# Patient Record
Sex: Male | Born: 1965 | Race: White | Hispanic: No | Marital: Single | Smoking: Former smoker
Health system: Southern US, Community
[De-identification: ages and names within clinical notes are randomized; demographics above are authoritative.]

---

## 2019-03-03 ENCOUNTER — Encounter (HOSPITAL_COMMUNITY): Payer: Self-pay

## 2019-03-03 ENCOUNTER — Emergency Department (HOSPITAL_COMMUNITY)
Admission: EM | Admit: 2019-03-03 | Discharge: 2019-03-04 | Disposition: A | Discharge status: Home / Self Care | Payer: Worker's Compensation | Attending: Emergency Medicine | Admitting: Emergency Medicine

## 2019-03-03 ENCOUNTER — Other Ambulatory Visit: Payer: Self-pay

## 2019-03-03 DIAGNOSIS — Y9389 Activity, other specified: Secondary | ICD-10-CM | POA: Diagnosis not present

## 2019-03-03 DIAGNOSIS — S80811A Abrasion, right lower leg, initial encounter: Secondary | ICD-10-CM | POA: Diagnosis not present

## 2019-03-03 DIAGNOSIS — Z87891 Personal history of nicotine dependence: Secondary | ICD-10-CM | POA: Insufficient documentation

## 2019-03-03 DIAGNOSIS — Y990 Civilian activity done for income or pay: Secondary | ICD-10-CM | POA: Diagnosis not present

## 2019-03-03 DIAGNOSIS — Y92812 Truck as the place of occurrence of the external cause: Secondary | ICD-10-CM | POA: Diagnosis not present

## 2019-03-03 DIAGNOSIS — W1789XA Other fall from one level to another, initial encounter: Secondary | ICD-10-CM | POA: Diagnosis not present

## 2019-03-03 DIAGNOSIS — I10 Essential (primary) hypertension: Secondary | ICD-10-CM | POA: Diagnosis not present

## 2019-03-03 DIAGNOSIS — I878 Other specified disorders of veins: Secondary | ICD-10-CM | POA: Insufficient documentation

## 2019-03-03 DIAGNOSIS — S8991XA Unspecified injury of right lower leg, initial encounter: Secondary | ICD-10-CM | POA: Diagnosis present

## 2019-03-03 LAB — LACTIC ACID, PLASMA: Lactic Acid, Venous: 1.2 mmol/L (ref 0.5–1.9)

## 2019-03-03 LAB — COMPREHENSIVE METABOLIC PANEL
ALT: 37 U/L (ref 0–44)
AST: 28 U/L (ref 15–41)
Albumin: 3.6 g/dL (ref 3.5–5.0)
Alkaline Phosphatase: 40 U/L (ref 38–126)
Anion gap: 11 (ref 5–15)
BUN: 12 mg/dL (ref 6–20)
CO2: 24 mmol/L (ref 22–32)
Calcium: 9.6 mg/dL (ref 8.9–10.3)
Chloride: 100 mmol/L (ref 98–111)
Creatinine, Ser: 1.15 mg/dL (ref 0.61–1.24)
GFR calc Af Amer: >60 mL/min (ref >60)
GFR calc non Af Amer: >60 mL/min (ref >60)
Glucose, Bld: 162 mg/dL — ABNORMAL HIGH (ref 70–99)
Potassium: 3.8 mmol/L (ref 3.5–5.1)
Sodium: 135 mmol/L (ref 135–145)
Total Bilirubin: 0.6 mg/dL (ref 0.3–1.2)
Total Protein: 7.2 g/dL (ref 6.5–8.1)

## 2019-03-03 LAB — CBC WITH DIFFERENTIAL/PLATELET
Abs Immature Granulocytes: 0.02 10*3/uL (ref 0.00–0.07)
Basophils Absolute: 0.1 10*3/uL (ref 0.0–0.1)
Basophils Relative: 1 %
Eosinophils Absolute: 0.4 10*3/uL (ref 0.0–0.5)
Eosinophils Relative: 4 %
HCT: 43.8 % (ref 39.0–52.0)
Hemoglobin: 14.6 g/dL (ref 13.0–17.0)
Immature Granulocytes: 0 %
Lymphocytes Relative: 35 %
Lymphs Abs: 3.6 10*3/uL (ref 0.7–4.0)
MCH: 29.7 pg (ref 26.0–34.0)
MCHC: 33.3 g/dL (ref 30.0–36.0)
MCV: 89.2 fL (ref 80.0–100.0)
Monocytes Absolute: 0.7 10*3/uL (ref 0.1–1.0)
Monocytes Relative: 6 %
Neutro Abs: 5.5 10*3/uL (ref 1.7–7.7)
Neutrophils Relative %: 54 %
Platelets: 315 10*3/uL (ref 150–400)
RBC: 4.91 MIL/uL (ref 4.22–5.81)
RDW: 13.6 % (ref 11.5–15.5)
WBC: 10.3 10*3/uL (ref 4.0–10.5)
nRBC: 0 % (ref 0.0–0.2)

## 2019-03-03 NOTE — ED Triage Notes (Signed)
Pt endorses right lower leg wound that occurred after the pt had a mechanical fall last Wednesday. Drainage noted, bandage in place. Not DM. VSS

## 2019-03-04 ENCOUNTER — Emergency Department (HOSPITAL_COMMUNITY): Payer: Worker's Compensation

## 2019-03-04 NOTE — Discharge Instructions (Addendum)
Apply antibiotic ointment and keep the wound covered while at work.  Because of the location of the wound, it will be slow to heal.  Return if signs of infection develop.

## 2019-03-04 NOTE — ED Provider Notes (Signed)
[facility] EMERGENCY DEPARTMENT Provider Note   CSN: [id number] Arrival date & time: 03/03/19  1804    History   Chief Complaint Chief Complaint  Patient presents with  . Leg Swelling  . Wound Infection    HPI [name] is a 53 y.o. male.   The history is provided by the patient.  He has history of hypertension and was sent here for evaluation of a wound to his right lower leg.  He had fallen getting out of his truck 5 days ago and suffered abrasions to his right lower leg.  He has been seen in a Workmen's Comp. clinic and was given use of parison cream and oral cephalexin.  He was seen today and they were concerned that it was not healing and referred him here for consideration for referral to a wound clinic.  He states there is pain when he tries to get up into his truck.  Denies fever or chills.  He is up-to-date on tetanus immunizations.  History reviewed. No pertinent past medical history.  There are no active problems to display for this patient.   History reviewed. No pertinent surgical history.      Home Medications    Prior to Admission medications   Not on File    Family History History reviewed. No pertinent family history.  Social History Social History   Tobacco Use  . Smoking status: Former Smoker    Types: Cigarettes  Substance Use Topics  . Alcohol use: Yes    Comment: occ  . Drug use: Never     Allergies   Patient has no known allergies.   Review of Systems Review of Systems  All other systems reviewed and are negative.    Physical Exam Updated Vital Signs BP (!) 164/94 (BP Location: Right Arm)   Pulse 61   Temp 97.8 F (36.6 C) (Oral)   Resp (!) 23   SpO2 96%   Physical Exam Vitals signs and nursing note reviewed.    53 year old male, resting comfortably and in no acute distress. Vital signs are significant for elevated blood pressure and borderline elevated respiratory rate. Oxygen saturation  is 96%, which is normal. Head is normocephalic and atraumatic. PERRLA, EOMI. Oropharynx is clear. Neck is nontender and supple without adenopathy or JVD. Back is nontender and there is no CVA tenderness. Lungs are clear without rales, wheezes, or rhonchi. Chest is nontender. Heart has regular rate and rhythm without murmur. Abdomen is soft, flat, nontender without masses or hepatosplenomegaly and peristalsis is normoactive. Extremities: Abrasion present on the anterior aspect of the right lower leg without evidence of infection.  Mild to moderate bilateral venous stasis changes are present. Skin is warm and dry without rash. Neurologic: Mental status is normal, cranial nerves are intact, there are no motor or sensory deficits.  ED Treatments / Results  Labs (all labs ordered are listed, but only abnormal results are displayed) Labs Reviewed  COMPREHENSIVE METABOLIC PANEL - Abnormal; Notable for the following components:      Result Value   Glucose, Bld 162 (*)    All other components within normal limits  LACTIC ACID, PLASMA  CBC WITH DIFFERENTIAL/PLATELET  LACTIC ACID, PLASMA   Radiology Dg Tibia/fibula Right  Result Date: 03/04/2019 CLINICAL DATA:  Fall several days ago with open leg wound and drainage, initial encounter EXAM: RIGHT TIBIA AND FIBULA - 2 VIEW COMPARISON:  None. FINDINGS: Changes consistent with prior healed fibular fracture are noted.  No acute fracture is seen. Mild degenerative changes about the knee joint are seen. Soft tissue swelling is noted anteriorly without underlying bony erosion. IMPRESSION: Soft tissue Injury without bony erosion. Electronically Signed   By: Alcide CleverMark  Lukens M.D.   On: 03/04/2019 00:48    Procedures Procedures   Medications Ordered in ED Medications - No data to display   Initial Impression / Assessment and Plan / ED Course  I have reviewed the triage vital signs and the nursing notes.  Pertinent labs & imaging results that were  available during my care of the patient were reviewed by me and considered in my medical decision making (see chart for details).  Abrasion on the right lower leg which is healing slowly.  Anticipate slow healing process based on body location and presence of venous stasis.  I have explained this to the patient.  Will check x-ray to make sure no underlying bony injury.  He has no prior records in the [facility].  X-rays are unremarkable.  Advised to continue local wound care but advised that it would take several weeks for this to heal.  Return precautions discussed.  Final Clinical Impressions(s) / ED Diagnoses   Final diagnoses:  Abrasion, right lower leg, initial encounter  Venous stasis of both lower extremities  Elevated blood pressure reading with diagnosis of hypertension    ED Discharge Orders    None       Dione BoozeGlick, [place], MD 03/04/19 [phone]

## 2019-03-04 NOTE — ED Notes (Signed)
Pt to xray via stretcher

## 2019-03-13 ENCOUNTER — Other Ambulatory Visit: Payer: Self-pay

## 2019-03-13 ENCOUNTER — Encounter (HOSPITAL_BASED_OUTPATIENT_CLINIC_OR_DEPARTMENT_OTHER): Payer: Worker's Compensation | Attending: Internal Medicine

## 2019-03-13 DIAGNOSIS — L97812 Non-pressure chronic ulcer of other part of right lower leg with fat layer exposed: Secondary | ICD-10-CM | POA: Diagnosis not present

## 2019-03-13 DIAGNOSIS — I10 Essential (primary) hypertension: Secondary | ICD-10-CM | POA: Diagnosis not present

## 2019-03-13 DIAGNOSIS — Z85528 Personal history of other malignant neoplasm of kidney: Secondary | ICD-10-CM | POA: Insufficient documentation

## 2019-03-13 DIAGNOSIS — Z905 Acquired absence of kidney: Secondary | ICD-10-CM | POA: Diagnosis not present

## 2019-03-13 DIAGNOSIS — I872 Venous insufficiency (chronic) (peripheral): Secondary | ICD-10-CM | POA: Diagnosis not present

## 2019-03-14 ENCOUNTER — Encounter (HOSPITAL_BASED_OUTPATIENT_CLINIC_OR_DEPARTMENT_OTHER): Payer: Worker's Compensation

## 2019-03-18 DIAGNOSIS — L97812 Non-pressure chronic ulcer of other part of right lower leg with fat layer exposed: Secondary | ICD-10-CM | POA: Diagnosis not present

## 2019-03-20 DIAGNOSIS — L97812 Non-pressure chronic ulcer of other part of right lower leg with fat layer exposed: Secondary | ICD-10-CM | POA: Diagnosis not present

## 2019-03-27 DIAGNOSIS — L97812 Non-pressure chronic ulcer of other part of right lower leg with fat layer exposed: Secondary | ICD-10-CM | POA: Diagnosis not present

## 2019-04-04 ENCOUNTER — Encounter (HOSPITAL_BASED_OUTPATIENT_CLINIC_OR_DEPARTMENT_OTHER): Payer: Worker's Compensation | Attending: Internal Medicine

## 2019-04-04 DIAGNOSIS — I10 Essential (primary) hypertension: Secondary | ICD-10-CM | POA: Insufficient documentation

## 2019-04-04 DIAGNOSIS — I872 Venous insufficiency (chronic) (peripheral): Secondary | ICD-10-CM | POA: Diagnosis not present

## 2019-04-04 DIAGNOSIS — L97812 Non-pressure chronic ulcer of other part of right lower leg with fat layer exposed: Secondary | ICD-10-CM | POA: Diagnosis not present

## 2019-04-08 DIAGNOSIS — L97812 Non-pressure chronic ulcer of other part of right lower leg with fat layer exposed: Secondary | ICD-10-CM | POA: Diagnosis not present

## 2019-04-15 DIAGNOSIS — L97812 Non-pressure chronic ulcer of other part of right lower leg with fat layer exposed: Secondary | ICD-10-CM | POA: Diagnosis not present

## 2019-04-22 DIAGNOSIS — L97812 Non-pressure chronic ulcer of other part of right lower leg with fat layer exposed: Secondary | ICD-10-CM | POA: Diagnosis not present

## 2019-05-01 ENCOUNTER — Encounter (HOSPITAL_BASED_OUTPATIENT_CLINIC_OR_DEPARTMENT_OTHER): Payer: Worker's Compensation | Attending: Internal Medicine | Admitting: Internal Medicine

## 2019-05-01 ENCOUNTER — Other Ambulatory Visit: Payer: Self-pay

## 2019-05-01 DIAGNOSIS — L97212 Non-pressure chronic ulcer of right calf with fat layer exposed: Secondary | ICD-10-CM | POA: Insufficient documentation

## 2019-05-01 DIAGNOSIS — I872 Venous insufficiency (chronic) (peripheral): Secondary | ICD-10-CM | POA: Diagnosis not present

## 2019-05-01 DIAGNOSIS — I10 Essential (primary) hypertension: Secondary | ICD-10-CM | POA: Diagnosis not present

## 2019-05-01 NOTE — Progress Notes (Signed)
[name] ([id number]) Visit Report for 05/01/2019 HPI Details Patient Name: Date of Service: [name], [name] 05/01/2019 10:15 AM Medical Record [id number] Patient Account Number: [id number] Date of Birth/Sex: Treating RN: 28-Aug-1965 (53 y.o. [name] Primary Care Provider: [facility], [place] Other Clinician: Karl Ito Referring Provider: Treating Provider/Extender:Robson, Myra Rude, [place] Weeks in Treatment: 7 History of Present Illness HPI Description: 53 year old male who fell off his truck 15 days ago, was seen in ED for right anterior leg wound 3 days after his fall and has been using an antibiotic cream, with dressing, was given a course of oral Keflex, and presents to the wound clinic for further care. 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 Patient was seen at urgent care and recommended mupirocin for 4 days. But he has been using an antibiotic cream along with dry dressing pads, the swelling was actually worse last week and has been coming down in the right leg, he has discomfort but not a whole lot of pain as he has been working with not much discomfort to the leg. He denies any fevers chills shakes or redness in the leg No notable or significant medical history No tobacco abuse history Medical history includes hypertension for which he takes losartan, history of renal cell cancer status post right nephrectomy ABI 1.32 on right 8/20; this is a patient who had a traumatic wound to the anterior right mid tibia area. He has some degree of chronic venous insufficiency. 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 He clearly cannot do this at this point. We have been using silver alginate. The wound was evacuated fully last time he has a probing tunnel at 11:00 8/27; traumatic wound to the right mid tibia in the setting of some degree of chronic venous insufficiency and secondary edema. We use silver alginate. He has a probing tunnel superiorly that went from 3.8-3.3 this time. The rest of the wound surface looks fairly healthy 9/4; traumatic wound to the right mid tibia in the setting of some degree of chronic venous insufficiency. The probing tunnel superiorly is down to 2.5 cm. The rest of the wound looks healthy with granulation that appears to be fully filling in the wound. We are using Hydrofera Blue *Dimensions are 3.4 x 2.1 x 0.9 with a tunnel at 2.4 superiorly at 12:00 9/15; traumatic wound in the right mid tibia in the setting of chronic venous insufficiency. Probing tunnel is down to 1-1/2 cm. The rest of this is healthy and appears to be filling in. We are using Hydrofera Blue 9/22; traumatic wound in the right mid tibia in the setting of chronic venous insufficiency. Unfortunately the tunnel is still at 1.8 cm today although the rest of the surface area of the wound more superficially is a lot better. Change the dressing from [facility] to endoform today hoping to get this tunnel to close 10/1; traumatic wound in the right mid tibia in  the setting of chronic venous insufficiency. Miraculously the tunnel is totally closed down today. The rest of his wound is epithelialized. Wound area is just at the tip of where the tunnel was. There is still probably 2 mm of depth here. We used endoform last week Electronic Signature(s) Signed: 05/01/2019 6:13:48 PM By: Baltazar Najjar MD Entered By: Baltazar Najjar on 05/01/2019  11:32:55 -------------------------------------------------------------------------------- Physical Exam Details Patient Name: Date of Service: [name], [name] 05/01/2019 10:15 AM Medical Record [id number] Patient Account Number: [id number] Date of Birth/Sex: Treating RN: 08/03/1965 (53 y.o. [name] Primary Care Provider: [facility], [place] Other Clinician: Karl Ito Referring Provider: Treating Provider/Extender:Robson, Myra Rude, [place] Weeks in Treatment: 7 Constitutional Sitting or standing Blood Pressure is within target range for patient.. Pulse regular and within target range for patient.Marland Kitchen Respirations regular, non-labored and within target range.. Temperature is normal and within the target range for the patient.Marland Kitchen Appears in no distress. Eyes Conjunctivae clear. No discharge.no icterus. Respiratory work of breathing is normal. Cardiovascular Pedal pulses are palpable. Edema control is excellent. Psychiatric appears at normal baseline. Notes Wound exam; continued improvement. The tunnel has totally closed down with endoform. Still a small area with approximately 2 mm of depth at the base but there is no probing. The rest of this is epithelialized. There is no evidence of surrounding infection Electronic Signature(s) Signed: 05/01/2019 6:13:48 PM By: Baltazar Najjar MD Entered By: Baltazar Najjar on 05/01/2019 11:34:21 -------------------------------------------------------------------------------- Physician Orders Details Patient Name: Date of Service: [name] 05/01/2019 10:15 AM Medical Record [id number] Patient Account Number: [id number] Date of Birth/Sex: Treating RN: Jun 09, 1966 (53 y.o. [name] Primary Care Provider: [facility], [place] Other Clinician: Karl Ito Referring Provider: Treating Provider/Extender:Robson, Myra Rude, [place] Weeks in Treatment: 7 Verbal / Phone Orders: No Diagnosis Coding ICD-10  Coding Code Description [phone] Non-pressure chronic ulcer of right calf with fat layer exposed I87.2 Venous insufficiency (chronic) (peripheral) Follow-up Appointments Return Appointment in 1 week. - Thursday Dressing Change Frequency Other: - Do not change dressing. wound center to change. Skin Barriers/Peri-Wound Care Barrier cream Moisturizing lotion Wound Cleansing May shower with protection. Primary Wound Dressing Hydrogel Endoform - moisten with hydrogel Secondary Dressing ABD pad Edema Control 4 layer compression - Right Lower Extremity - apply unna boot at upper portion to lower leg to aid in holding compression wrap in place. Avoid standing for long periods of time Elevate legs to the level of the heart or above for 30 minutes daily and/or when sitting, a frequency of: - several times throughout the day. Additional Orders / Instructions Other: - Patient can return to work as long as he can sit and elevate right leg multiple times a day. Patient's leg cannot be dependent for long periods of time. Electronic Signature(s) Signed: 05/01/2019 6:13:48 PM By: Baltazar Najjar MD Signed: 05/01/2019 6:31:45 PM By: Shawn Stall Entered By: Shawn Stall on 05/01/2019 11:10:48 -------------------------------------------------------------------------------- Problem List Details Patient Name: Date of Service: [name], [name] 05/01/2019 10:15 AM Medical Record [id number] Patient Account Number: [id number] Date of Birth/Sex: Treating RN: 04/19/1966 (53 y.o. [name] Primary Care Provider: [facility], [place] Other Clinician: Karl Ito Referring Provider: Treating Provider/Extender:Robson, Myra Rude, [place] Weeks in Treatment: 7 Active Problems ICD-10 Evaluated Encounter Code Description Active Date Today Diagnosis L97.212 Non-pressure chronic ulcer of right calf with fat layer 03/13/2019 No Yes exposed I87.2 Venous insufficiency (chronic) (peripheral)  03/13/2019 No Yes Inactive Problems Resolved Problems Electronic Signature(s) Signed: 05/01/2019 6:13:48 PM By: Baltazar Najjar MD Entered By: Baltazar Najjar  on 05/01/2019 11:31:12 -------------------------------------------------------------------------------- Progress Note Details Patient Name: Date of Service: [name], Nicholas E. 05/01/2019 10:15 AM Medical Record [id number] Patient Account Number: [id number] Date of Birth/Sex: Treating RN: Sep 06, 1965 (53 y.o. Harlon FlorM) Deaton, Millard.LoaBobbi [facility], [place] Other Clinician: [name], [name] Referring Provider: Treating Provider/Extender:Robson, [name], [place] Weeks in Treatment: 7 Subjective History of Present Illness (HPI) 53 year old male who fell off his truck 15 days ago, was seen in ED for right anterior leg wound 3 days after his fall and has been using an antibiotic cream, with dressing, was given a course of oral Keflex, and presents to the wound clinic for further care. 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 Patient was seen at urgent care and recommended mupirocin for 4 days. But he has been using an antibiotic cream along with dry dressing pads, the swelling was actually worse last week and has been coming down in the right leg, he has discomfort but not a whole lot of pain as he has been working with not much discomfort to the leg. He denies any fevers chills shakes or redness in the leg No notable or significant medical history No tobacco abuse history Medical history includes hypertension for which he takes losartan, history of renal  cell cancer status post right nephrectomy ABI 1.32 on right 8/20; this is a patient who had a traumatic wound to the anterior right mid tibia area. He has some degree of chronic venous insufficiency. 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 He clearly cannot do this at this point. We have been using silver alginate. The wound was evacuated fully last time he has a probing tunnel at 11:00 8/27; traumatic wound to the right mid tibia in the setting of some degree of chronic venous insufficiency and secondary edema. We use silver alginate. He has a probing tunnel superiorly that went from 3.8-3.3 this time. The rest of the wound surface looks fairly healthy 9/4; traumatic wound to the right mid tibia in the setting of some degree of chronic venous insufficiency. The probing tunnel superiorly is down to 2.5 cm. The rest of the wound looks healthy with granulation that appears to be fully filling in the wound. We are using Hydrofera Blue *Dimensions are 3.4 x 2.1 x 0.9 with a tunnel at 2.4 superiorly at 12:00 9/15; traumatic wound in the right mid tibia in the setting of chronic venous insufficiency. Probing tunnel is down to 1-1/2 cm. The rest of this is healthy and appears to be filling in. We are using Hydrofera Blue 9/22; traumatic wound in the right mid tibia in the setting of chronic venous insufficiency. Unfortunately the tunnel is still at 1.8 cm today although the rest of the surface area of the wound more superficially is a lot better. [facility]oform today hoping to get this tunnel to close 10/1; traumatic wound in the right mid tibia in the setting of  chronic venous insufficiency. Miraculously the tunnel is totally closed down today. The rest of his wound is epithelialized. Wound area is just at the tip of where the tunnel was. There is still probably 2 mm of  depth here. We used endoform last week Objective Constitutional Sitting or standing Blood Pressure is within target range for patient.. Pulse regular and within target range for patient.Marland Kitchen Respirations regular, non-labored and within target range.. Temperature is normal and within the target range for the patient.Marland Kitchen Appears in no distress. Vitals Time Taken: 10:48 AM, Height: 69 in, Weight: 325 lbs, BMI: 48, Temperature: 98.2 F, Pulse: 61 bpm, Respiratory Rate: 17 breaths/min, Blood Pressure: 139/75 mmHg. Eyes Conjunctivae clear. No discharge.no icterus. Respiratory work of breathing is normal. Cardiovascular Pedal pulses are palpable. Edema control is excellent. Psychiatric appears at normal baseline. General Notes: Wound exam; continued improvement. The tunnel has totally closed down with endoform. Still a small area with approximately 2 mm of depth at the base but there is no probing. The rest of this is epithelialized. There is no evidence of surrounding infection Integumentary (Hair, Skin) Wound #1 status is Open. Original cause of wound was Trauma. The wound is located on the Right,Anterior Lower Leg. The wound measures 1.2cm length x 0.6cm width x 0.4cm depth; 0.565cm^2 area and 0.226cm^3 volume. There is Fat Layer (Subcutaneous Tissue) Exposed exposed. There is no tunneling or undermining noted. There is a medium amount of serosanguineous drainage noted. The wound margin is flat and intact. There is large (67-100%) red, pink granulation within the wound bed. There is no necrotic tissue within the wound bed. Assessment Active Problems ICD-10 Non-pressure chronic ulcer of right calf with fat layer exposed Venous insufficiency (chronic) (peripheral) Procedures Wound #1 Pre-procedure diagnosis of Wound #1 is a Trauma, Other located on the Right,Anterior Lower Leg . There was a Four Layer Compression Therapy Procedure with a pre-treatment ABI of 1.3 by Zenaida Deed, RN. Post  procedure Diagnosis Wound #1: Same as Pre-Procedure Plan Follow-up Appointments: Return Appointment in 1 week. - Thursday Dressing Change Frequency: Other: - Do not change dressing. wound center to change. Skin Barriers/Peri-Wound Care: Barrier cream Moisturizing lotion Wound Cleansing: May shower with protection. Primary Wound Dressing: Hydrogel Endoform - moisten with hydrogel Secondary Dressing: ABD pad Edema Control: 4 layer compression - Right Lower Extremity - apply unna boot at upper portion to lower leg to aid in holding compression wrap in place. Avoid standing for long periods of time Elevate legs to the level of the heart or above for 30 minutes daily and/or when sitting, a frequency of: - several times throughout the day. Additional Orders / Instructions: Other: - Patient can return to work as long as he can sit and elevate right leg multiple times a day. Patient's leg cannot be dependent for long periods of time. 1. Continue endoform with hydrogel 4-layer compression Electronic Signature(s) Signed: 05/01/2019 6:13:48 PM By: Baltazar Najjar MD Entered By: Baltazar Najjar on 05/01/2019 11:34:55 -------------------------------------------------------------------------------- SuperBill Details Patient Name: Date of Service: [name], [name] 05/01/2019 Medical Record [id number] Patient Account Number: [id number] Date of Birth/Sex: Treating RN: 1966/07/09 (53 y.o. [name] Primary Care Provider: [facility], [place] Other Clinician: Karl Ito Referring Provider: Treating Provider/Extender:Robson, Myra Rude, [place] Weeks in Treatment: 7 Diagnosis Coding ICD-10 Codes Code Description [phone] Non-pressure chronic ulcer of right calf with fat layer exposed I87.2 Venous insufficiency (chronic) (peripheral) Facility Procedures CPT4 Code Description: [id number] (Facility Use Only) [phone] - APPLY MULTLAY COMPRS LWR RT LEG Modifier: Quantity: 1  Physician  Procedures CPT4 Code: [id number] Description: 15520 - WC PHYS LEVEL 3 - EST PT ICD-10 Diagnosis Description E02.233 Non-pressure chronic ulcer of right calf with fat lay I87.2 Venous insufficiency (chronic) (peripheral) Modifier: er exposed Quantity: 1 Electronic Signature(s) Signed: 05/01/2019 6:13:48 PM By: Linton Ham MD Entered By: Linton Ham on 05/01/2019 11:35:13

## 2019-05-09 ENCOUNTER — Other Ambulatory Visit: Payer: Self-pay

## 2019-05-09 ENCOUNTER — Encounter (HOSPITAL_BASED_OUTPATIENT_CLINIC_OR_DEPARTMENT_OTHER): Payer: Worker's Compensation | Attending: Internal Medicine | Admitting: Internal Medicine

## 2019-05-09 DIAGNOSIS — L97812 Non-pressure chronic ulcer of other part of right lower leg with fat layer exposed: Secondary | ICD-10-CM | POA: Diagnosis not present

## 2019-05-09 DIAGNOSIS — I10 Essential (primary) hypertension: Secondary | ICD-10-CM | POA: Insufficient documentation

## 2019-05-09 DIAGNOSIS — I872 Venous insufficiency (chronic) (peripheral): Secondary | ICD-10-CM | POA: Insufficient documentation

## 2019-05-09 NOTE — Progress Notes (Signed)
[name], [name] ([id number]) Visit Report for 05/01/2019 Arrival Information Details Patient Name: Date of Service: [name], [name] 05/01/2019 10:15 AM Medical Record [id number] Patient Account Number: [id number] Date of Birth/Sex: Treating RN: 04-20-1966 (53 y.o. Katherina Right Primary Care Mana Morison: [facility], [place] Other Clinician: Karl Ito Referring Monty Mccarrell: Treating Anna Beaird/Extender:Robson, Myra Rude, [place] Weeks in Treatment: 7 Visit Information History Since Last Visit Added or deleted any medications: No Patient Arrived: Ambulatory Any new allergies or adverse reactions: No Arrival Time: 10:43 Had a fall or experienced change in No Accompanied By: self, activities of daily living that may affect caseworker risk of falls: Transfer Assistance: None Signs or symptoms of abuse/neglect since last No Patient Identification Verified: Yes visito Secondary Verification Process Yes Hospitalized since last visit: No Completed: Implantable device outside of the clinic excluding No Patient Requires Transmission-Based No cellular tissue based products placed in the center Precautions: since last visit: Patient Has Alerts: No Has Dressing in Place as Prescribed: Yes Has Compression in Place as Prescribed: Yes Pain Present Now: No Electronic Signature(s) Signed: 05/09/2019 5:13:27 PM By: Cherylin Mylar Entered By: Cherylin Mylar on 05/01/2019 10:43:47 -------------------------------------------------------------------------------- Compression Therapy Details Patient Name: Date of Service: [name], [name] 05/01/2019 10:15 AM Medical Record [id number] Patient Account Number: [id number] Date of Birth/Sex: Treating RN: [birth year] (53 y.o. [name] Primary Care Raea Magallon: [facility], [place] Other Clinician: Karl Ito Referring Debbora Ang: Treating Mayling Aber/Extender:Robson, Myra Rude, [place] Weeks in Treatment: 7 Compression Therapy  Performed for Wound Wound #1 Right,Anterior Lower Leg Assessment: Performed By: Clinician Zenaida Deed, RN Compression Type: Four Layer Pre Treatment ABI: 1.3 Post Procedure Diagnosis Same as Pre-procedure Electronic Signature(s) Signed: 05/01/2019 6:31:45 PM By: Shawn Stall Entered By: Shawn Stall on 05/01/2019 11:09:34 -------------------------------------------------------------------------------- Encounter Discharge Information Details Patient Name: Date of Service: [name], [name] 05/01/2019 10:15 AM Medical Record [id number] Patient Account Number: [id number] Date of Birth/Sex: Treating RN: October 26, 1965 (53 y.o. Damaris Schooner Primary Care Jakeia Carreras: [facility], [place] Other Clinician: Karl Ito Referring Takoda Janowiak: Treating Dunia Pringle/Extender:Robson, Myra Rude, [place] Weeks in Treatment: 7 Encounter Discharge Information Items Discharge Condition: Stable Ambulatory Status: Ambulatory Discharge Destination: Home Transportation: Private Auto Accompanied By: case manager Schedule Follow-up Appointment: Yes Clinical Summary of Care: Patient Declined Electronic Signature(s) Signed: 05/02/2019 7:05:19 PM By: Zenaida Deed RN, BSN Entered By: Zenaida Deed on 05/01/2019 11:45:57 -------------------------------------------------------------------------------- Lower Extremity Assessment Details Patient Name: Date of Service: [name], [name] 05/01/2019 10:15 AM Medical Record [id number] Patient Account Number: [id number] Date of Birth/Sex: Treating RN: January 25, 1966 (53 y.o. Katherina Right Primary Care Miklos Bidinger: [facility], [place] Other Clinician: Karl Ito Referring Kyren Vaux: Treating Zailyn Rowser/Extender:Robson, Myra Rude, [place] Weeks in Treatment: 7 Edema Assessment Assessed: [Left: No] [Right: No] Edema: [Left: Ye] [Right: s] Calf Left: Right: Point of Measurement: 32 cm From Medial Instep cm 47.5 cm Ankle Left: Right: Point of  Measurement: 10 cm From Medial Instep cm 25 cm Vascular Assessment Pulses: Dorsalis Pedis Palpable: [Right:Yes] Electronic Signature(s) Signed: 05/09/2019 5:13:27 PM By: Cherylin Mylar Entered By: Cherylin Mylar on 05/01/2019 10:52:00 -------------------------------------------------------------------------------- Multi Wound Chart Details Patient Name: Date of Service: [name] 05/01/2019 10:15 AM Medical Record [id number] Patient Account Number: [id number] Date of Birth/Sex: Treating RN: [birth year] (53 y.o. [name] Primary Care Lucile Didonato: [facility], [place] Other Clinician: Karl Ito Referring Gayatri Teasdale: Treating Chanay Nugent/Extender:Robson, Myra Rude, [place] Weeks in Treatment: 7 Vital Signs Height(in): 69 Pulse(bpm): 61 Weight(lbs): 325 Blood Pressure(mmHg): 139/75 Body Mass Index(BMI): 48 Temperature(F): 98.2 Respiratory 17 Rate(breaths/min): Photos: [1:No Photos] [N/A:N/A] Wound Location: [1:Right Lower Leg -  Anterior] [N/A:N/A] Wounding Event: [1:Trauma] [N/A:N/A] Primary Etiology: [1:Trauma, Other] [N/A:N/A] Comorbid History: [1:Hypertension] [N/A:N/A] Date Acquired: [1:02/25/2019] [N/A:N/A] Weeks of Treatment: [1:7] [N/A:N/A] Wound Status: [1:Open] [N/A:N/A] Measurements L x W x D [1:1.2x0.6x0.4] [N/A:N/A] (cm) Area (cm) : [1:0.565] [N/A:N/A] Volume (cm) : [1:0.226] [N/A:N/A] % Reduction in Area: [1:96.80%] [N/A:N/A] % Reduction in Volume: [1:99.30%] [N/A:N/A] Classification: [1:Full Thickness Without Exposed Support Structures] [N/A:N/A] Exudate Amount: [1:Medium] [N/A:N/A] Exudate Type: [1:Serosanguineous] [N/A:N/A] Exudate Color: [1:red, brown] [N/A:N/A] Wound Margin: [1:Flat and Intact] [N/A:N/A] Granulation Amount: [1:Large (67-100%)] [N/A:N/A] Granulation Quality: [1:Red, Pink] [N/A:N/A] Necrotic Amount: [1:None Present (0%)] [N/A:N/A] Exposed Structures: [1:Fat Layer (Subcutaneous N/A Tissue) Exposed: Yes Fascia: No  Tendon: No Muscle: No Joint: No Bone: No] Epithelialization: [1:Small (1-33%) Compression Therapy] [N/A:N/A N/A] Treatment Notes Electronic Signature(s) Signed: 05/01/2019 6:13:48 PM By: Baltazar Najjarobson, [name] MD Signed: 05/01/2019 6:31:45 PM By: Shawn Stalleaton, Bobbi Entered By: Baltazar Najjarobson, [name] on 05/01/2019 11:31:39 -------------------------------------------------------------------------------- Multi-Disciplinary Care Plan Details Patient Name: Date of Service: Nicholas MunroFREEMAN, Nicholas E. 05/01/2019 10:15 AM Medical Record [id number] Patient Account Number: [id number] Date of Birth/Sex: Treating RN: Dec 20, 1965 (53 y.o. Harlon FlorM) Deaton, Millard.LoaBobbi Primary Care Abby Tucholski: [facility], [place] Other Clinician: [name], [name] Referring Zakiyyah Savannah: Treating Ayomikun Starling/Extender:Robson, [name], [place] Weeks in Treatment: 7 Active Inactive Nutrition Nursing Diagnoses: Potential for alteratiion in Nutrition/Potential for imbalanced nutrition Goals: Patient/caregiver agrees to and verbalizes understanding of need to obtain nutritional consultation Date Initiated: 03/13/2019 Target Resolution Date: 05/30/2019 Goal Status: Active Interventions: Provide education on nutrition Treatment Activities: Education provided on Nutrition : 03/13/2019 Patient referred to Primary Care Physician for further nutritional evaluation : 03/13/2019 Notes: Wound/Skin Impairment Nursing Diagnoses: Knowledge deficit related to ulceration/compromised skin integrity Goals: Patient/caregiver will verbalize understanding of skin care regimen Date Initiated: 03/13/2019 Target Resolution Date: 05/16/2019 Goal Status: Active Ulcer/skin breakdown will heal within 14 weeks Date Initiated: 03/13/2019 Target Resolution Date: 07/04/2019 Goal Status: Active Interventions: Assess patient/caregiver ability to obtain necessary supplies Assess patient/caregiver ability to perform ulcer/skin care regimen upon admission and as needed Assess ulceration(s) every  visit Provide education on ulcer and skin care Treatment Activities: Skin care regimen initiated : 03/13/2019 Topical wound management initiated : 03/13/2019 Notes: Electronic Signature(s) Signed: 05/01/2019 6:31:45 PM By: Shawn Stalleaton, Bobbi Entered By: Shawn Stalleaton, Bobbi on 05/01/2019 10:41:54 -------------------------------------------------------------------------------- Pain Assessment Details Patient Name: Date of Service: Nicholas MunroFREEMAN, Nicholas E. 05/01/2019 10:15 AM Medical Record [id number] Patient Account Number: [id number] Date of Birth/Sex: Treating RN: Dec 20, 1965 (53 y.o. Katherina RightM) Dwiggins, Shannon Primary Care Harmony Sandell: [facility], [place] Other Clinician: [name], [name] Referring Akayla Brass: Treating Newell Wafer/Extender:Robson, [name], [place] Weeks in Treatment: 7 Active Problems Location of Pain Severity and Description of Pain Patient Has Paino No Site Locations Pain Management and Medication Current Pain Management: Electronic Signature(s) Signed: 05/09/2019 5:13:27 PM By: Cherylin Mylarwiggins, Shannon Entered By: Cherylin Mylarwiggins, Shannon on 05/01/2019 10:51:11 -------------------------------------------------------------------------------- Patient/Caregiver Education Details Patient Name: Date of Service: Nicholas MunroFREEMAN, Nicholas E. 10/1/2020andnbsp10:15 AM Medical Record [id number] Patient Account Number: [id number] Date of Birth/Gender: Dec 20, 1965 (53 y.o. M) Treating RN: Shawn Stalleaton, Bobbi Primary [facility] Physician: [facility], [place] Other Clinician: [name], [name] Referring Physician: Treating Physician/Extender:Robson, [name], [place] Weeks in Treatment: 7 Education Assessment Education Provided To: Patient Education Topics Provided Wound/Skin Impairment: Handouts: Caring for Your Ulcer Methods: Explain/Verbal Responses: Reinforcements needed Electronic Signature(s) Signed: 05/01/2019 6:31:45 PM By: Shawn Stalleaton, Bobbi Entered By: Shawn Stalleaton, Bobbi on 05/01/2019  10:42:05 -------------------------------------------------------------------------------- Wound Assessment Details Patient Name: Date of Service: Nicholas MunroFREEMAN, Nicholas E. 05/01/2019 10:15 AM Medical Record [id number] Patient Account Number: [id number] Date of Birth/Sex: Treating RN: Dec 20, 1965 (53 y.o. Katherina RightM) Dwiggins, Shannon Primary Care Alma Mohiuddin:  [facility], [place] Other Clinician: Sandre Kitty Referring Jaxtin Raimondo: Treating Callaway Hailes/Extender:Robson, Vivia Budge, [place] Weeks in Treatment: 7 Wound Status Wound Number: 1 Primary Etiology: Trauma, Other Wound Location: Right Lower Leg - Anterior Wound Status: Open Wounding Event: Trauma Comorbid History: Hypertension Date Acquired: 02/25/2019 Weeks Of Treatment: 7 Clustered Wound: No Wound Measurements Length: (cm) 1.2 % Reduct Width: (cm) 0.6 % Reduct Depth: (cm) 0.4 Epitheli Area: (cm) 0.565 Tunneli Volume: (cm) 0.226 Undermi Wound Description Full Thickness Without Exposed Support Foul Od Classification: Structures Slough/ Wound Flat and Intact Margin: Exudate Medium Amount: Exudate Serosanguineous Type: Exudate red, brown Color: Wound Bed Granulation Amount: Large (67-100%) Granulation Quality: Red, Pink Fascia Necrotic Amount: None Present (0%) Fat Lay Tendon Muscle Joint E Bone Ex or After Cleansing: No Fibrino No Exposed Structure Exposed: No er (Subcutaneous Tissue) Exposed: Yes Exposed: No Exposed: No xposed: No posed: No ion in Area: 96.8% ion in Volume: 99.3% alization: Small (1-33%) ng: No ning: No Electronic Signature(s) Signed: 05/09/2019 5:13:27 PM By: Kela Millin Entered By: Kela Millin on 05/01/2019 10:59:59 -------------------------------------------------------------------------------- Vitals Details Patient Name: Date of Service: [name] 05/01/2019 10:15 AM Medical Record [id number] Patient Account Number: [id number] Date of Birth/Sex: Treating  RN: 1966/01/15 (53 y.o. Marvis Repress Primary Care Kadden Osterhout: [facility], [place] Other Clinician: Sandre Kitty Referring Dajion Bickford: Treating Rosaleah Person/Extender:Robson, Vivia Budge, [place] Weeks in Treatment: 7 Vital Signs Time Taken: 10:48 Temperature (F): 98.2 Height (in): 69 Pulse (bpm): 61 Weight (lbs): 325 Respiratory Rate (breaths/min): 17 Body Mass Index (BMI): 48 Blood Pressure (mmHg): 139/75 Reference Range: 80 - 120 mg / dl Electronic Signature(s) Signed: 05/09/2019 5:13:27 PM By: Kela Millin Entered By: Kela Millin on 05/01/2019 10:51:03

## 2019-05-13 NOTE — Progress Notes (Signed)
[name], Nicholas E. ([id number]) Visit Report for 05/09/2019 HPI Details Patient Name: Date of Service: [name], Nicholas E. 05/09/2019 8:15 AM Medical Record [id number] Patient Account Number: [id number] Date of Birth/Sex: Treating RN: 12/03/1965 (53 y.o. Nicholas KollerM) [name], [name] Primary Care Provider: [facility], [place] Other Clinician: Referring Provider: Treating Provider/Extender:Avery Eustice, [name], [place] Weeks in Treatment: 8 History of Present Illness HPI Description: 53 year old male who fell off his truck 15 days ago, was seen in ED for right anterior leg wound 3 days after his fall and has been using an antibiotic cream, with dressing, was given a course of oral Keflex, and presents to the wound clinic for further care. 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 Patient was seen at urgent care and recommended mupirocin for 4 days. But he has been using an antibiotic cream along with dry dressing pads, the swelling was actually worse last week and has been coming down in the right leg, he has discomfort but not a whole lot of pain as he has been working with not much discomfort to the leg. He denies any fevers chills shakes or redness in the leg No notable or significant medical history No tobacco abuse history Medical history includes hypertension for which he takes losartan, history of renal cell cancer status post right nephrectomy ABI 1.32 on right 8/20; this is a patient who had a traumatic wound to the anterior right mid tibia area. He has some degree of chronic venous insufficiency. 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 He clearly cannot do this at this point. We have been using silver alginate. The wound was evacuated fully last time he has a probing tunnel at 11:00 8/27; traumatic wound to the right mid tibia in the setting of some degree of chronic venous insufficiency and secondary edema. We use silver alginate. He has a probing tunnel superiorly that went from 3.8-3.3 this time. The rest of the wound surface looks fairly healthy 9/4; traumatic wound to the right mid tibia in the setting of some degree of chronic venous insufficiency. The probing tunnel superiorly is down to 2.5 cm. The rest of the wound looks healthy with granulation that appears to be fully filling in the wound. We are using Hydrofera Blue *Dimensions are 3.4 x 2.1 x 0.9 with a tunnel at 2.4 superiorly at 12:00 9/15; traumatic wound in the right mid tibia in the setting of chronic venous insufficiency. Probing tunnel is down to 1-1/2 cm. The rest of this is healthy and appears to be filling in. We are using Hydrofera Blue 9/22; traumatic wound in the right mid tibia in the setting of chronic venous insufficiency. Unfortunately the tunnel is still at 1.8 cm today although the rest of the surface area of the wound more superficially is a lot better. [facility] the [facility] to endoform today hoping to get this tunnel to close 10/1; traumatic wound in the right mid tibia in the setting  of chronic venous insufficiency. Miraculously the tunnel is totally closed down today. The rest of his wound is epithelialized. Wound area is just at the tip of where the tunnel was. There is still probably 2 mm of depth here. We used endoform last week 10/9; tunnel is still closed wound is superficial but did not epithelialize as much as I might of like. I changed him from endoform back to [employer]) Signed:  05/11/2019 10:27:00 AM By: Linton Ham MD Entered By: Linton Ham on 05/09/2019 09:40:51 -------------------------------------------------------------------------------- Physical Exam Details Patient Name: Date of Service: [name] 05/09/2019 8:15 AM Medical Record [id number] Patient Account Number: [id number] Date of Birth/Sex: Treating RN: [birth year] (53 y.o. [name] Primary Care Provider: [facility], [place] Other Clinician: Referring Provider: Treating Provider/Extender:Jadia Capers, Vivia Budge, [place] Weeks in Treatment: 8 Constitutional Patient is hypertensive.. Pulse regular and within target range for patient.Marland Kitchen Respirations regular, non-labored and within target range.. Temperature is normal and within the target range for the patient.Marland Kitchen Appears in no distress. Notes Wound exam; continued improvement. The tunnel was closed. The area is bigger but superficial. Electronic Signature(s) Signed: 05/11/2019 10:27:00 AM By: Linton Ham MD Entered By: Linton Ham on 05/09/2019 09:41:40 -------------------------------------------------------------------------------- Physician Orders Details Patient Name: Date of Service: DARY, Nicholas 05/09/2019 8:15 AM Medical Record [id number] Patient Account Number: [id number] Date of Birth/Sex: Treating RN: 1966-01-27 (53 y.o. [name] Primary Care Provider: [facility], [place] Other Clinician: Referring Provider: Treating Provider/Extender:Lenia Housley, Vivia Budge, [place] Weeks in Treatment: 8 Verbal / Phone Orders: No Diagnosis Coding ICD-10 Coding Code Description [phone] Non-pressure chronic ulcer of right calf with fat layer exposed I87.2 Venous insufficiency (chronic) (peripheral) Follow-up Appointments Return Appointment in 1 week. Dressing Change Frequency Wound #1 Right,Anterior Lower Leg Other: - Do not change dressing. wound center to change. Skin Barriers/Peri-Wound Care Wound #1  Right,Anterior Lower Leg Barrier cream Moisturizing lotion Wound Cleansing Wound #1 Right,Anterior Lower Leg May shower with protection. Primary Wound Dressing Wound #1 Right,Anterior Lower Leg Hydrofera Blue Secondary Dressing Wound #1 Right,Anterior Lower Leg ABD pad Edema Control 4 layer compression - Right Lower Extremity - apply unna boot at upper portion to lower leg to aid in holding compression wrap in place. Avoid standing for long periods of time Elevate legs to the level of the heart or above for 30 minutes daily and/or when sitting, a frequency of: - several times throughout the day. Additional Orders / Instructions Other: - Patient can return to work as long as he can sit and elevate right leg multiple times a day. Patient's leg cannot be dependent for long periods of time. Electronic Signature(s) Signed: 05/11/2019 10:27:00 AM By: Linton Ham MD Signed: 05/13/2019 5:52:09 PM By: Levan Hurst RN, BSN Entered By: Levan Hurst on 05/09/2019 08:41:24 -------------------------------------------------------------------------------- Problem List Details Patient Name: Date of Service: JASPAL, PULTZ 05/09/2019 8:15 AM Medical Record [id number] Patient Account Number: [id number] Date of Birth/Sex: Treating RN: 01-24-1966 (53 y.o. [name] Primary Care Provider: [facility], [place] Other Clinician: Referring Provider: Treating Provider/Extender:Cozy Veale, Vivia Budge, [place] Weeks in Treatment: 8 Active Problems ICD-10 Evaluated Encounter Code Description Active Date Today Diagnosis L97.212 Non-pressure chronic ulcer of right calf with fat layer 03/13/2019 No Yes exposed I87.2 Venous insufficiency (chronic) (peripheral) 03/13/2019 No Yes Inactive Problems Resolved Problems Electronic Signature(s) Signed: 05/11/2019 10:27:00 AM By: Linton Ham MD Entered By: Linton Ham on 05/09/2019  09:40:14 -------------------------------------------------------------------------------- Progress Note Details Patient Name: Date of Service: Heywood Iles 05/09/2019 8:15 AM Medical Record [id number] Patient  Account Number: [id number] Date of Birth/Sex: Treating RN: 1966-05-27 (53 y.o. [name] Primary Care Provider: [facility], [place] Other Clinician: Referring Provider: Treating Provider/Extender:Rem[place]ton Urieta, Myra Rude, [place] Weeks in Treatment: 8 Subjective History of Present Illness (HPI) 53 year old male who fell off his truck 15 days ago, was seen in ED for right anterior leg wound 3 days after his fall and has been using an antibiotic cream, with dressing, was given a course of oral Keflex, and presents to the wound clinic for further care. 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 Patient was seen at urgent care and recommended mupirocin for 4 days. But he has been using an antibiotic cream along with dry dressing pads, the swelling was actually worse last week and has been coming down in the right leg, he has discomfort but not a whole lot of pain as he has been working with not much discomfort to the leg. He denies any fevers chills shakes or redness in the leg No notable or significant medical history No tobacco abuse history Medical history includes hypertension for which he takes losartan, history of renal cell cancer status post right nephrectomy ABI 1.32 on right 8/20; this is a patient who had a traumatic wound to the anterior right mid tibia area. He has some degree of chronic venous  insufficiency. 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 He clearly cannot do this at this point. We have been using silver alginate. The wound was evacuated fully last time he has a probing tunnel at 11:00 8/27; traumatic wound to the right mid tibia in the setting of some degree of chronic venous insufficiency and secondary edema. We use silver alginate. He has a probing tunnel superiorly that went from 3.8-3.3 this time. The rest of the wound surface looks fairly healthy 9/4; traumatic wound to the right mid tibia in the setting of some degree of chronic venous insufficiency. The probing tunnel superiorly is down to 2.5 cm. The rest of the wound looks healthy with granulation that appears to be fully filling in the wound. We are using Hydrofera Blue *Dimensions are 3.4 x 2.1 x 0.9 with a tunnel at 2.4 superiorly at 12:00 9/15; traumatic wound in the right mid tibia in the setting of chronic venous insufficiency. Probing tunnel is down to 1-1/2 cm. The rest of this is healthy and appears to be filling in. We are using Hydrofera Blue 9/22; traumatic wound in the right mid tibia in the setting of chronic venous insufficiency. Unfortunately the tunnel is still at 1.8 cm today although the rest of the surface area of the wound more superficially is a lot better. Change the dressing from [facility] to endoform today hoping to get this tunnel to close 10/1; traumatic wound in the right mid tibia in the setting of chronic venous insufficiency. Miraculously the tunnel is totally closed down today. The rest of his wound is epithelialized. Wound area is just at the  tip of where the tunnel was. There is still probably 2 mm of depth here. We used endoform last week 10/9; tunnel is still closed wound is superficial but did not epithelialize as much as I might of like. I changed him from endoform back to  [facility] Objective Constitutional Patient is hypertensive.. Pulse regular and within target range for patient.Marland Kitchen Respirations regular, non-labored and within target range.. Temperature is normal and within the target range for the patient.Marland Kitchen Appears in no distress. Vitals Time Taken: 8:00 AM, Height: 69 in, Weight: 325 lbs, BMI: 48, Temperature: 98.1 F, Pulse: 61 bpm, Respiratory Rate: 20 breaths/min, Blood Pressure: 161/95 mmHg. General Notes: Wound exam; continued improvement. The tunnel was closed. The area is bigger but superficial. Integumentary (Hair, Skin) Wound #1 status is Open. Original cause of wound was Trauma. The wound is located on the Right,Anterior Lower Leg. The wound measures 2.8cm length x 0.9cm width x 0.1cm depth; 1.979cm^2 area and 0.198cm^3 volume. There is Fat Layer (Subcutaneous Tissue) Exposed exposed. There is no tunneling or undermining noted. There is a medium amount of serosanguineous drainage noted. The wound margin is flat and intact. There is large (67-100%) red, pink granulation within the wound bed. There is no necrotic tissue within the wound bed. Assessment Active Problems ICD-10 Non-pressure chronic ulcer of right calf with fat layer exposed Venous insufficiency (chronic) (peripheral) Procedures Wound #1 Pre-procedure diagnosis of Wound #1 is a Trauma, Other located on the Right,Anterior Lower Leg . There was a Four Layer Compression Therapy Procedure by Zandra Abts, RN. Post procedure Diagnosis Wound #1: Same as Pre-Procedure Plan Follow-up Appointments: Return Appointment in 1 week. Dressing Change Frequency: Wound #1 Right,Anterior Lower Leg: Other: - Do not change dressing. wound center to change. Skin Barriers/Peri-Wound Care: Wound #1 Right,Anterior Lower Leg: Barrier cream Moisturizing lotion Wound Cleansing: Wound #1 Right,Anterior Lower Leg: May shower with protection. Primary Wound Dressing: Wound #1 Right,Anterior Lower  Leg: Hydrofera Blue Secondary Dressing: Wound #1 Right,Anterior Lower Leg: ABD pad Edema Control: 4 layer compression - Right Lower Extremity - apply unna boot at upper portion to lower leg to aid in holding compression wrap in place. Avoid standing for long periods of time Elevate legs to the level of the heart or above for 30 minutes daily and/or when sitting, a frequency of: - several times throughout the day. Additional Orders / Instructions: Other: - Patient can return to work as long as he can sit and elevate right leg multiple times a day. Patient's leg cannot be dependent for long periods of time. 1. Disappointing increase in surface area 2. Changed him back to [facility] from the collagen still under 4-layer compression Electronic Signature(s) Signed: 05/11/2019 10:27:00 AM By: Baltazar Najjar MD Entered By: Baltazar Najjar on 05/09/2019 09:42:14 -------------------------------------------------------------------------------- SuperBill Details Patient Name: Date of Service: [place], CREHAN 05/09/2019 Medical Record [id number] Patient Account Number: [id number] Date of Birth/Sex: Treating RN: Dec 19, 1965 (53 y.o. [name] Primary Care Provider: [facility], [place] Other Clinician: Referring Provider: Treating Provider/Extender:Miguel Medal, Myra Rude, [place] Weeks in Treatment: 8 Diagnosis Coding ICD-10 Codes Code Description [phone] Non-pressure chronic ulcer of right calf with fat layer exposed I87.2 Venous insufficiency (chronic) (peripheral) Facility Procedures CPT4 Code Description: [id number] (Facility Use Only) [phone] - APPLY MULTLAY COMPRS LWR RT LEG Modifier: Quantity: 1 Physician Procedures CPT4 Code: [id number] Description: 93716 - WC PHYS LEVEL 2 - EST PT ICD-10 Diagnosis Description L97.212 Non-pressure chronic ulcer of right calf with fat lay I87.2 Venous insufficiency (chronic) (peripheral) Modifier: er  exposed Quantity: 1 Electronic  Signature(s) Signed: 05/11/2019 10:27:00 AM By: Baltazar Najjar MD Entered By: Baltazar Najjar on 05/09/2019 09:42:33

## 2019-05-13 NOTE — Progress Notes (Signed)
ZAKAREE, MCCLENAHAN ([id number]) Visit Report for 05/09/2019 Arrival Information Details Patient Name: Date of Service: KEMAR, PANDIT 05/09/2019 8:15 AM Medical Record [id number] Patient Account Number: [id number] Date of Birth/Sex: Treating RN: 1966-01-24 (53 y.o. Katherina Right Primary Care Caro Brundidge: [facility], [place] Other Clinician: Referring Sudiksha Victor: Treating Yesennia Hirota/Extender:Robson, Myra Rude, [place] Weeks in Treatment: 8 Visit Information History Since Last Visit Added or deleted any medications: No Patient Arrived: Ambulatory Any new allergies or adverse reactions: No Arrival Time: 08:04 Had a fall or experienced change in No Accompanied By: self activities of daily living that may affect Transfer Assistance: None risk of falls: Patient Identification Verified: Yes Signs or symptoms of abuse/neglect since last No Secondary Verification Process Completed: Yes visito Patient Requires Transmission-Based No Hospitalized since last visit: No Precautions: Implantable device outside of the clinic excluding No Patient Has Alerts: No cellular tissue based products placed in the center since last visit: Has Dressing in Place as Prescribed: Yes Has Compression in Place as Prescribed: Yes Pain Present Now: No Electronic Signature(s) Signed: 05/09/2019 5:13:27 PM By: Cherylin Mylar Entered By: Cherylin Mylar on 05/09/2019 08:05:13 -------------------------------------------------------------------------------- Compression Therapy Details Patient Name: Date of Service: [name], TRICKEY 05/09/2019 8:15 AM Medical Record [id number] Patient Account Number: [id number] Date of Birth/Sex: Treating RN: 1965/11/04 (53 y.o. Elizebeth Koller Primary Care Rory Montel: [facility], [place] Other Clinician: Referring Nieve Rojero: Treating Mikayela Deats/Extender:Robson, Myra Rude, [place] Weeks in Treatment: 8 Compression Therapy Performed for Wound Wound #1 Right,Anterior Lower  Leg Assessment: Performed By: Clinician Zandra Abts, RN Compression Type: Four Layer Post Procedure Diagnosis Same as Pre-procedure Electronic Signature(s) Signed: 05/13/2019 5:52:09 PM By: Zandra Abts RN, BSN Entered By: Zandra Abts on 05/09/2019 08:42:13 -------------------------------------------------------------------------------- Encounter Discharge Information Details Patient Name: Date of Service: ADOM, SCHOENECK 05/09/2019 8:15 AM Medical Record [id number] Patient Account Number: [id number] Date of Birth/Sex: Treating RN: 1966-06-30 (53 y.o. [name] Primary Care Alvia Tory: [facility], [place] Other Clinician: Referring Ferris Fielden: Treating Sidharth Leverette/Extender:Robson, Myra Rude, [place] Weeks in Treatment: 8 Encounter Discharge Information Items Discharge Condition: Stable Ambulatory Status: Ambulatory Discharge Destination: Home Transportation: Private Auto Accompanied By: case manager Schedule Follow-up Appointment: Yes Clinical Summary of Care: Electronic Signature(s) Signed: 05/09/2019 6:01:54 PM By: Shawn Stall Entered By: Shawn Stall on 05/09/2019 09:39:16 -------------------------------------------------------------------------------- Lower Extremity Assessment Details Patient Name: Date of Service: BRANDELL, MAREADY 05/09/2019 8:15 AM Medical Record [id number] Patient Account Number: [id number] Date of Birth/Sex: Treating RN: [birth year] (53 y.o. Katherina Right Primary Care Earon Rivest: [facility], [place] Other Clinician: Referring Ayeden Gladman: Treating Erasmus Bistline/Extender:Robson, Myra Rude, [place] Weeks in Treatment: 8 Edema Assessment Assessed: [Left: No] [Right: No] Edema: [Left: Ye] [Right: s] Calf Left: Right: Point of Measurement: 32 cm From Medial Instep cm 45 cm Ankle Left: Right: Point of Measurement: 10 cm From Medial Instep cm 24 cm Vascular Assessment Pulses: Dorsalis Pedis Palpable: [Right:Yes] Electronic  Signature(s) Signed: 05/09/2019 5:13:27 PM By: Cherylin Mylar Entered By: Cherylin Mylar on 05/09/2019 08:07:03 -------------------------------------------------------------------------------- Multi Wound Chart Details Patient Name: Date of Service: Jonna Munro 05/09/2019 8:15 AM Medical Record [id number] Patient Account Number: [id number] Date of Birth/Sex: Treating RN: 1965-08-31 (53 y.o. Elizebeth Koller Primary Care Adiva Boettner: [facility], [place] Other Clinician: Referring Tre Sanker: Treating Ladainian Therien/Extender:Robson, Myra Rude, [place] Weeks in Treatment: 8 Vital Signs Height(in): 69 Pulse(bpm): 61 Weight(lbs): 325 Blood Pressure(mmHg): 161/95 Body Mass Index(BMI): 48 Temperature(F): 98.1 Respiratory 20 Rate(breaths/min): Photos: [1:No Photos] [N/A:N/A] Wound Location: [1:Right Lower Leg - Anterior] [N/A:N/A] Wounding Event: [1:Trauma] [N/A:N/A] Primary Etiology: [1:Trauma, Other] [N/A:N/A] Comorbid History: [1:Hypertension] [N/A:N/A] Date Acquired: [  1:02/25/2019] [N/A:N/A] Weeks of Treatment: [1:8] [N/A:N/A] Wound Status: [1:Open] [N/A:N/A] Measurements L x W x D [1:2.8x0.9x0.1] [N/A:N/A] (cm) Area (cm) : [1:1.979] [N/A:N/A] Volume (cm) : [1:0.198] [N/A:N/A] % Reduction in Area: [1:88.90%] [N/A:N/A] % Reduction in Volume: [1:99.40%] [N/A:N/A] Classification: [1:Full Thickness Without Exposed Support Structures] [N/A:N/A] Exudate Amount: [1:Medium] [N/A:N/A] Exudate Type: [1:Serosanguineous] [N/A:N/A] Exudate Color: [1:red, brown] [N/A:N/A] Wound Margin: [1:Flat and Intact] [N/A:N/A] Granulation Amount: [1:Large (67-100%)] [N/A:N/A] Granulation Quality: [1:Red, Pink] [N/A:N/A] Necrotic Amount: [1:None Present (0%)] [N/A:N/A] Exposed Structures: [1:Fat Layer (Subcutaneous Tissue) Exposed: Yes Fascia: No Tendon: No Muscle: No Joint: No Bone: No] [N/A:N/A] Epithelialization: [1:Small (1-33%) Compression Therapy] [N/A:N/A N/A] Treatment Notes Wound #1  (Right, Anterior Lower Leg) 1. Cleanse With Wound Cleanser Soap and water 2. Periwound Care Moisturizing lotion 3. Primary Dressing Applied Hydrofera Blue 4. Secondary Dressing Dry Gauze 6. Support Layer Applied 4 layer compression wrap Notes netting. Electronic Signature(s) Signed: 05/11/2019 10:27:00 AM By: Linton Ham MD Signed: 05/13/2019 5:52:09 PM By: Levan Hurst RN, BSN Entered By: Linton Ham on 05/09/2019 09:40:28 -------------------------------------------------------------------------------- Multi-Disciplinary Care Plan Details Patient Name: Date of Service: WINSON, EICHORN 05/09/2019 8:15 AM Medical Record [id number] Patient Account Number: [id number] Date of Birth/Sex: Treating RN: 1965-11-11 (53 y.o. [name] Primary Care Brenyn Petrey: [facility], [place] Other Clinician: Referring Khalil Szczepanik: Treating Laia Wiley/Extender:Robson, Vivia Budge, [place] Weeks in Treatment: 8 Active Inactive Nutrition Nursing Diagnoses: Potential for alteratiion in Nutrition/Potential for imbalanced nutrition Goals: Patient/caregiver agrees to and verbalizes understanding of need to obtain nutritional consultation Date Initiated: 03/13/2019 Target Resolution Date: 05/30/2019 Goal Status: Active Interventions: Provide education on nutrition Treatment Activities: Education provided on Nutrition : 03/27/2019 Patient referred to Primary Care Physician for further nutritional evaluation : 03/13/2019 Notes: Wound/Skin Impairment Nursing Diagnoses: Knowledge deficit related to ulceration/compromised skin integrity Goals: Patient/caregiver will verbalize understanding of skin care regimen Date Initiated: 03/13/2019 Target Resolution Date: 05/16/2019 Goal Status: Active Ulcer/skin breakdown will heal within 14 weeks Date Initiated: 03/13/2019 Target Resolution Date: 07/04/2019 Goal Status: Active Interventions: Assess patient/caregiver ability to obtain necessary  supplies Assess patient/caregiver ability to perform ulcer/skin care regimen upon admission and as needed Assess ulceration(s) every visit Provide education on ulcer and skin care Treatment Activities: Skin care regimen initiated : 03/13/2019 Topical wound management initiated : 03/13/2019 Notes: Electronic Signature(s) Signed: 05/13/2019 5:52:09 PM By: Levan Hurst RN, BSN Entered By: Levan Hurst on 05/09/2019 08:01:25 -------------------------------------------------------------------------------- Pain Assessment Details Patient Name: Date of Service: EMAURI, KRYGIER 05/09/2019 8:15 AM Medical Record [id number] Patient Account Number: [id number] Date of Birth/Sex: Treating RN: 1965/10/24 (53 y.o. Marvis Repress Primary Care Moyses Pavey: [facility], [place] Other Clinician: Referring Rily Nickey: Treating Jamir Rone/Extender:Robson, Vivia Budge, [place] Weeks in Treatment: 8 Active Problems Location of Pain Severity and Description of Pain Patient Has Paino No Site Locations Pain Management and Medication Current Pain Management: Electronic Signature(s) Signed: 05/09/2019 5:13:27 PM By: Kela Millin Entered By: Kela Millin on 05/09/2019 08:05:58 -------------------------------------------------------------------------------- Patient/Caregiver Education Details Patient Name: Date of Service: Heywood Iles 10/9/2020andnbsp8:15 AM Medical Record [id number] Patient Account Number: [id number] Date of Birth/Gender: [birth year] (53 y.o. M) Treating RN: Levan Hurst Primary Care Physician: [facility], [place] Other Clinician: Referring Physician: Treating Physician/Extender:Robson, Vivia Budge, [place] Weeks in Treatment: 8 Education Assessment Education Provided To: Patient Education Topics Provided Nutrition: Methods: Explain/Verbal Responses: State content correctly Wound/Skin Impairment: Methods: Explain/Verbal Responses: State content  correctly Electronic Signature(s) Signed: 05/13/2019 5:52:09 PM By: Levan Hurst RN, BSN Entered By: Levan Hurst on 05/09/2019 08:01:37 -------------------------------------------------------------------------------- Wound Assessment Details Patient Name: Date of Service: Domingo Cocking,  Denzell E. 05/09/2019 8:15 AM Medical Record [id number] Patient Account Number: [id number] Date of Birth/Sex: Treating RN: 01/20/1966 (53 y.o. Katherina RightM) Dwiggins, Shannon Primary Care Peaches Vanoverbeke: [facility], [place] Other Clinician: Referring Marquel Pottenger: Treating Jastin Fore/Extender:Robson, [name], [place] Weeks in Treatment: 8 Wound Status Wound Number: 1 Primary Etiology: Trauma, Other Wound Location: Right Lower Leg - Anterior Wound Status: Open Wounding Event: Trauma Comorbid History: Hypertension Date Acquired: 02/25/2019 Weeks Of Treatment: 8 Clustered Wound: No Photos Wound Measurements Length: (cm) 2.8 % Reduct Width: (cm) 0.9 % Reduct Depth: (cm) 0.1 Epitheli Area: (cm) 1.979 Tunneli Volume: (cm) 0.198 Undermi Wound Description Full Thickness Without Exposed Support Foul Od Classification: Structures Slough/ Wound Flat and Intact Margin: Exudate Medium Amount: Exudate Serosanguineous Type: Exudate red, brown Color: Wound Bed Granulation Amount: Large (67-100%) Granulation Quality: Red, Pink Fascia Exp Necrotic Amount: None Present (0%) Fat Layer Tendon Exp Muscle Exp Joint Expo Bone Expos or After Cleansing: No Fibrino No Exposed Structure osed: No (Subcutaneous Tissue) Exposed: Yes osed: No osed: No sed: No ed: No ion in Area: 88.9% ion in Volume: 99.4% alization: Small (1-33%) ng: No ning: No Treatment Notes Wound #1 (Right, Anterior Lower Leg) 1. Cleanse With Wound Cleanser Soap and water 2. Periwound Care Moisturizing lotion 3. Primary Dressing Applied Hydrofera Blue 4. Secondary Dressing Dry Gauze 6. Support Layer Applied 4 layer compression  wrap Notes netting. Electronic Signature(s) Signed: 05/12/2019 3:43:33 PM By: Benjaman KindlerJones, Dedrick EMT/HBOT Signed: 05/12/2019 5:18:03 PM By: Cherylin Mylarwiggins, Shannon Previous Signature: 05/09/2019 5:13:27 PM Version By: Cherylin Mylarwiggins, Shannon Entered By: Benjaman KindlerJones, Dedrick on 05/12/2019 08:50:58 -------------------------------------------------------------------------------- Vitals Details Patient Name: Date of Service: [name], Nihar E. 05/09/2019 8:15 AM Medical Record [id number] Patient Account Number: [id number] Date of Birth/Sex: Treating RN: 02/24/1966 (53 y.o. Katherina RightM) Dwiggins, Shannon Primary Care Keefe Zawistowski: [facility], [place] Other Clinician: Referring Kabeer Hoagland: Treating Janelle Culton/Extender:Robson, [name], [place] Weeks in Treatment: 8 Vital Signs Time Taken: 08:00 Temperature (F): 98.1 Height (in): 69 Pulse (bpm): 61 Weight (lbs): 325 Respiratory Rate (breaths/min): 20 Body Mass Index (BMI): 48 Blood Pressure (mmHg): 161/95 Reference Range: 80 - 120 mg / dl Electronic Signature(s) Signed: 05/09/2019 5:13:27 PM By: Cherylin Mylarwiggins, Shannon Entered By: Cherylin Mylarwiggins, Shannon on 05/09/2019 08:05:51

## 2019-05-16 ENCOUNTER — Other Ambulatory Visit: Payer: Self-pay

## 2019-05-16 ENCOUNTER — Encounter (HOSPITAL_BASED_OUTPATIENT_CLINIC_OR_DEPARTMENT_OTHER): Payer: Worker's Compensation | Admitting: Internal Medicine

## 2019-05-16 DIAGNOSIS — L97212 Non-pressure chronic ulcer of right calf with fat layer exposed: Secondary | ICD-10-CM | POA: Diagnosis not present

## 2019-05-19 NOTE — Progress Notes (Signed)
Nicholas MunroFREEMAN, Kolton E. ([id number]) Visit Report for 05/16/2019 HPI Details Patient Name: Date of Service: Nicholas MunroFREEMAN, Daunte E. 05/16/2019 9:15 AM Medical Record [id number] Patient Account Number: [id number] Date of Birth/Sex: Treating RN: 1966/05/30 (53 y.o. [name]) [name], [name] Primary Care Provider: [facility], [place] Other Clinician: Referring Provider: Treating Provider/Extender:Robson, [name], [place] Weeks in Treatment: 9 History of Present Illness HPI Description: 53 year old male who fell off his truck 15 days ago, was seen in ED for right anterior leg wound 3 days after his fall and has been using an antibiotic cream, with dressing, was given a course of oral Keflex, and presents to the wound clinic for further care. 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 Patient was seen at urgent care and recommended mupirocin for 4 days. But he has been using an antibiotic cream along with dry dressing pads, the swelling was actually worse last week and has been coming down in the right leg, he has discomfort but not a whole lot of pain as he has been working with not much discomfort to the leg. He denies any fevers chills shakes or redness in the leg No notable or significant medical history No tobacco abuse history Medical history includes hypertension for which he takes losartan, history of renal cell cancer status post right nephrectomy ABI 1.32 on right 8/20; this is a patient who had a traumatic wound to the anterior right mid tibia area. He has some degree of chronic venous insufficiency. 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 He clearly cannot do this at this point. We have been using silver alginate. The wound was evacuated fully last time he has a probing tunnel at 11:00 8/27; traumatic wound to the right mid tibia in the setting of some degree of chronic venous insufficiency and secondary edema. We use silver alginate. He has a probing tunnel superiorly that went from 3.8-3.3 this time. The rest of the wound surface looks fairly healthy 9/4; traumatic wound to the right mid tibia in the setting of some degree of chronic venous insufficiency. The probing tunnel superiorly is down to 2.5 cm. The rest of the wound looks healthy with granulation that appears to be fully filling in the wound. We are using Hydrofera Blue *Dimensions are 3.4 x 2.1 x 0.9 with a tunnel at 2.4 superiorly at 12:00 9/15; traumatic wound in the right mid tibia in the setting of chronic venous insufficiency. Probing tunnel is down to 1-1/2 cm. The rest of this is healthy and appears to be filling in. We are using Hydrofera Blue 9/22; traumatic wound in the right mid tibia in the setting of chronic venous insufficiency. Unfortunately the tunnel is still at 1.8 cm today although the rest of the surface area of the wound more superficially is a lot better. [facility] today hoping to get this tunnel to close 10/1; traumatic wound in the right mid tibia in the setting  of chronic venous insufficiency. Miraculously the tunnel is totally closed down today. The rest of his wound is epithelialized. Wound area is just at the tip of where the tunnel was. There is still probably 2 mm of depth here. We used endoform last week 10/9; tunnel is still closed wound is superficial but did not epithelialize as much as I might of like. I changed him from endoform back to [facility] 10/16; tunnel is closed. Area is  slightly hyper granulated. I been using Hydrofera Blue Electronic Signature(s) Signed: 05/16/2019 5:33:39 PM By: Baltazar Najjar MD Entered By: Baltazar Najjar on 05/16/2019 10:14:17 -------------------------------------------------------------------------------- Chemical Cauterization Details Patient Name: Date of Service: KEYMON, MCELROY 05/16/2019 9:15 AM Medical Record [id number] Patient Account Number: [id number] Date of Birth/Sex: Treating RN: [birth year] (53 y.o. Elizebeth Koller Primary Care Provider: [facility], [place] Other Clinician: Referring Provider: Treating Provider/Extender:Robson, Myra Rude, [place] Weeks in Treatment: 9 Procedure Performed for: Wound #1 Right,Anterior Lower Leg Performed By: Physician Maxwell Caul., MD Post Procedure Diagnosis Same as Pre-procedure Notes silver nitrate Electronic Signature(s) Signed: 05/16/2019 5:33:39 PM By: Baltazar Najjar MD Entered By: Baltazar Najjar on 05/16/2019 10:19:09 -------------------------------------------------------------------------------- Physical Exam Details Patient Name: Date of Service: [place], PERRA 05/16/2019 9:15 AM Medical Record [id number] Patient Account Number: [id number] Date of Birth/Sex: Treating RN: February 27, 1966 (53 y.o. Elizebeth Koller Primary Care Provider: [facility], [place] Other Clinician: Referring Provider: Treating Provider/Extender:Robson, Myra Rude, [place] Weeks in Treatment: 9 Constitutional Patient is hypertensive.. Pulse regular and within target range for patient.Marland Kitchen Respirations regular, non-labored and within target range.. Temperature is normal and within the target range for the patient.Marland Kitchen Appears in no distress. Notes Superficial wound. Surface area is down hyper granulation not down with silver nitrate. Some eschar removed with a #3 curette. There is no tunneled area. No evidence of infection Electronic Signature(s) Signed: 05/16/2019 5:33:39 PM By:  Baltazar Najjar MD Entered By: Baltazar Najjar on 05/16/2019 10:17:36 -------------------------------------------------------------------------------- Physician Orders Details Patient Name: Date of Service: NANCY, MANUELE 05/16/2019 9:15 AM Medical Record [id number] Patient Account Number: [id number] Date of Birth/Sex: Treating RN: 08/12/1965 (53 y.o. Elizebeth Koller Primary Care Provider: [facility], [place] Other Clinician: Referring Provider: Treating Provider/Extender:Robson, Myra Rude, [place] Weeks in Treatment: 9 Verbal / Phone Orders: No Diagnosis Coding ICD-10 Coding Code Description [phone] Non-pressure chronic ulcer of right calf with fat layer exposed I87.2 Venous insufficiency (chronic) (peripheral) Follow-up Appointments Return Appointment in 1 week. Dressing Change Frequency Wound #1 Right,Anterior Lower Leg Other: - Do not change dressing. wound center to change. Skin Barriers/Peri-Wound Care Wound #1 Right,Anterior Lower Leg Barrier cream Moisturizing lotion Wound Cleansing Wound #1 Right,Anterior Lower Leg May shower with protection. Primary Wound Dressing Wound #1 Right,Anterior Lower Leg Hydrofera Blue Secondary Dressing Wound #1 Right,Anterior Lower Leg ABD pad Edema Control 4 layer compression - Right Lower Extremity - apply unna boot at upper portion to lower leg to aid in holding compression wrap in place. Avoid standing for long periods of time Elevate legs to the level of the heart or above for 30 minutes daily and/or when sitting, a frequency of: - several times throughout the day. Additional Orders / Instructions Other: - Patient can return to work as long as he can sit and elevate right leg multiple times a day. Patient's leg cannot be dependent for long periods of time. Electronic Signature(s) Signed: 05/16/2019 5:33:39 PM By: Baltazar Najjar MD Signed: 05/16/2019 6:00:55 PM By: Zandra Abts RN, BSN Entered By: Zandra Abts on  05/16/2019 10:17:09 -------------------------------------------------------------------------------- Problem List  Details Patient Name: Date of Service: [place], AUSTAD 05/16/2019 9:15 AM Medical Record [id number] Patient Account Number: [id number] Date of Birth/Sex: Treating RN: [birth year] (53 y.o. Elizebeth Koller Primary Care Provider: [facility], [place] Other Clinician: Referring Provider: Treating Provider/Extender:Robson, Myra Rude, [place] Weeks in Treatment: 9 Active Problems ICD-10 Evaluated Encounter Code Description Active Date Today Diagnosis L97.212 Non-pressure chronic ulcer of right calf with fat layer 03/13/2019 No Yes exposed I87.2 Venous insufficiency (chronic) (peripheral) 03/13/2019 No Yes Inactive Problems Resolved Problems Electronic Signature(s) Signed: 05/16/2019 5:33:39 PM By: Baltazar Najjar MD Entered By: Baltazar Najjar on 05/16/2019 10:10:23 -------------------------------------------------------------------------------- Progress Note Details Patient Name: Date of Service: [name] 05/16/2019 9:15 AM Medical Record [id number] Patient Account Number: [id number] Date of Birth/Sex: Treating RN: January 21, 1966 (53 y.o. Elizebeth Koller Primary Care Provider: [facility], [place] Other Clinician: Referring Provider: Treating Provider/Extender:Robson, Myra Rude, [place] Weeks in Treatment: 9 Subjective History of Present Illness (HPI) 53 year old male who fell off his truck 15 days ago, was seen in ED for right anterior leg wound 3 days after his fall and has been using an antibiotic cream, with dressing, was given a course of oral Keflex, and presents to the wound clinic for further care. 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 Patient was seen at urgent care and recommended mupirocin for 4 days. But he has been using an antibiotic cream along with dry dressing pads, the swelling was actually worse last week and has been coming down in the right leg, he has discomfort but not a whole lot of pain as he has been working with not much discomfort to the leg. He denies any fevers chills shakes or redness in the leg No notable or significant medical history No tobacco abuse history Medical history includes hypertension for which he takes losartan, history of renal cell cancer status post right nephrectomy ABI 1.32 on right 8/20; this is a patient who had a traumatic wound to the anterior right mid tibia area. He has some degree of chronic venous insufficiency. 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 He clearly cannot do this at this point. We have been using silver alginate. The wound was evacuated fully last time he has a probing tunnel at 11:00 8/27; traumatic wound to the right mid tibia in the setting of some degree of chronic venous insufficiency and secondary edema. We use silver alginate. He has a probing tunnel superiorly that went from 3.8-3.3 this time. The rest of the wound surface looks fairly healthy 9/4; traumatic wound to the right mid tibia in the setting of some degree of chronic venous insufficiency. The probing tunnel superiorly is down to 2.5 cm. The rest of the wound looks healthy with granulation that appears to be fully filling in the wound. We are using Hydrofera Blue *Dimensions are 3.4 x 2.1 x 0.9 with a tunnel at 2.4 superiorly at 12:00 9/15; traumatic wound in the right mid tibia in the  setting of chronic venous insufficiency. Probing tunnel  is down to 1-1/2 cm. The rest of this is healthy and appears to be filling in. We are using Hydrofera Blue 9/22; traumatic wound in the right mid tibia in the setting of chronic venous insufficiency. Unfortunately the tunnel is still at 1.8 cm today although the rest of the surface area of the wound more superficially is a lot better. Change the dressing from [facility] to endoform today hoping to get this tunnel to close 10/1; traumatic wound in the right mid tibia in the setting of chronic venous insufficiency. Miraculously the tunnel is totally closed down today. The rest of his wound is epithelialized. Wound area is just at the tip of where the tunnel was. There is still probably 2 mm of depth here. We used endoform last week 10/9; tunnel is still closed wound is superficial but did not epithelialize as much as I might of like. I changed him from endoform back to [facility] 10/16; tunnel is closed. Area is slightly hyper granulated. I been using Hydrofera Blue Objective Constitutional Patient is hypertensive.. Pulse regular and within target range for patient.Marland Kitchen Respirations regular, non-labored and within target range.. Temperature is normal and within the target range for the patient.Marland Kitchen Appears in no distress. Vitals Time Taken: 9:19 AM, Height: 69 in, Weight: 325 lbs, BMI: 48, Temperature: 98.5 F, Pulse: 63 bpm, Respiratory Rate: 20 breaths/min, Blood Pressure: 144/76 mmHg. General Notes: Superficial wound. Surface area is down hyper granulation not down with silver nitrate. Some eschar removed with a #3 curette. There is no tunneled area. No evidence of infection Integumentary (Hair, Skin) Wound #1 status is Open. Original cause of wound was Trauma. The wound is located on the Right,Anterior Lower Leg. The wound measures 1cm length x 0.7cm width x 0.1cm depth; 0.55cm^2 area and 0.055cm^3 volume. Assessment Active Problems ICD-10 Non-pressure chronic ulcer of right calf with  fat layer exposed Venous insufficiency (chronic) (peripheral) Procedures Wound #1 Pre-procedure diagnosis of Wound #1 is a Trauma, Other located on the Right,Anterior Lower Leg . There was a Four Layer Compression Therapy Procedure by Levan Hurst, RN. Post procedure Diagnosis Wound #1: Same as Pre-Procedure Pre-procedure diagnosis of Wound #1 is a Trauma, Other located on the Right,Anterior Lower Leg . An Chemical Cauterization procedure was performed by Ricard Dillon., MD. Post procedure Diagnosis Wound #1: Same as Pre-Procedure Notes: silver nitrate Plan Follow-up Appointments: Return Appointment in 1 week. Dressing Change Frequency: Wound #1 Right,Anterior Lower Leg: Other: - Do not change dressing. wound center to change. Skin Barriers/Peri-Wound Care: Wound #1 Right,Anterior Lower Leg: Barrier cream Moisturizing lotion Wound Cleansing: Wound #1 Right,Anterior Lower Leg: May shower with protection. Primary Wound Dressing: Wound #1 Right,Anterior Lower Leg: Hydrofera Blue Secondary Dressing: Wound #1 Right,Anterior Lower Leg: ABD pad Edema Control: 4 layer compression - Right Lower Extremity - apply unna boot at upper portion to lower leg to aid in holding compression wrap in place. Avoid standing for long periods of time Elevate legs to the level of the heart or above for 30 minutes daily and/or when sitting, a frequency of: - several times throughout the day. Additional Orders / Instructions: Other: - Patient can return to work as long as he can sit and elevate right leg multiple times a day. Patient's leg cannot be dependent for long periods of time. 1. Hydrofera Blue/ABD/4 layer compression Electronic Signature(s) Signed: 05/16/2019 5:33:39 PM By: Linton Ham MD Entered By: Linton Ham on 05/16/2019 10:19:53 -------------------------------------------------------------------------------- SuperBill Details  Patient Name: Date of Service: DEMAREE, LIBERTO 05/16/2019 Medical Record [id number] Patient Account Number: [id number] Date of Birth/Sex: Treating RN: May 15, 1966 (53 y.o. Elizebeth Koller Primary Care Provider: [facility], [place] Other Clinician: Referring Provider: Treating Provider/Extender:Robson, Myra Rude, [place] Weeks in Treatment: 9 Diagnosis Coding ICD-10 Codes Code Description L97.212 Non-pressure chronic ulcer of right calf with fat layer exposed I87.2 Venous insufficiency (chronic) (peripheral) Facility Procedures CPT4 Code Description: [id number] - CHEM CAUT GRANULATION TISS ICD-10 Diagnosis Description L97.212 Non-pressure chronic ulcer of right calf with fat layer exposed I87.2 Venous insufficiency (chronic) (peripheral) Modifier: Quantity: 1 CPT4 Code Description: [id number] (Facility Use Only) [id number] - APPLY MULTLAY COMPRS LWR RT LEG Modifier: Quantity: 1 Physician Procedures CPT4 Code Description: [id number] - WC PHYS CHEM CAUT GRAN TISSUE ICD-10 Diagnosis Description L97.212 Non-pressure chronic ulcer of right calf with fat layer exp I87.2 Venous insufficiency (chronic) (peripheral) Modifier: osed Quantity: 1 Electronic Signature(s) Signed: 05/16/2019 6:00:55 PM By: Zandra Abts RN, BSN Signed: 05/19/2019 5:54:03 PM By: Baltazar Najjar MD Previous Signature: 05/16/2019 5:33:39 PM Version By: Baltazar Najjar MD Previous Signature: 05/16/2019 5:33:39 PM Version By: Baltazar Najjar MD Entered By: Zandra Abts on 05/16/2019 17:37:12

## 2019-05-23 ENCOUNTER — Encounter (HOSPITAL_BASED_OUTPATIENT_CLINIC_OR_DEPARTMENT_OTHER): Payer: Worker's Compensation | Admitting: Internal Medicine

## 2019-05-23 ENCOUNTER — Other Ambulatory Visit: Payer: Self-pay

## 2019-05-23 DIAGNOSIS — L97212 Non-pressure chronic ulcer of right calf with fat layer exposed: Secondary | ICD-10-CM | POA: Diagnosis not present

## 2019-05-23 NOTE — Progress Notes (Signed)
[name], [name] ([id number]) Visit Report for 05/23/2019 Debridement Details Patient Name: Date of Service: [name], [name] 05/23/2019 9:45 AM Medical Record [id number] Patient Account Number: [id number] Date of Birth/Sex: [birth year] (53 y.o. M) Treating RN: Zandra Abts Primary Care Provider: [facility], [place] Other Clinician: Referring Provider: Treating Provider/Extender:Robson, Myra Rude, [place] Weeks in Treatment: 10 Debridement Performed for Wound #1 Right,Anterior Lower Leg Assessment: Performed By: Physician Maxwell Caul., MD Debridement Type: Debridement Level of Consciousness (Pre- Awake and Alert procedure): Pre-procedure Yes - 10:22 Verification/Time Out Taken: Start Time: 10:22 Pain Control: Lidocaine 4% Topical Solution Total Area Debrided (L x W): 0.5 (cm) x 0.5 (cm) = 0.25 (cm) Tissue and other material Non-Viable, Eschar debrided: Level: Non-Viable Tissue Debridement Description: Selective/Open Wound Instrument: Curette Bleeding: Minimum Hemostasis Achieved: Pressure End Time: 10:23 Procedural Pain: 0 Post Procedural Pain: 0 Response to Treatment: Procedure was tolerated well Level of Consciousness Awake and Alert (Post-procedure): Post Debridement Measurements of Total Wound Length: (cm) 0.5 Width: (cm) 0.5 Depth: (cm) 0.1 Volume: (cm) 0.02 Character of Wound/Ulcer Post Improved Debridement: Post Procedure Diagnosis Same as Pre-procedure Electronic Signature(s) Signed: 05/23/2019 5:59:58 PM By: Zandra Abts RN, BSN Signed: 05/23/2019 6:06:00 PM By: Baltazar Najjar MD Entered By: Zandra Abts on 05/23/2019 10:23:35 -------------------------------------------------------------------------------- HPI Details Patient Name: Date of Service: [name] 05/23/2019 9:45 AM Medical Record [id number] Patient Account Number: [id number] Date of Birth/Sex: Treating RN: Nov 01, 1965 (53 y.o. Elizebeth Koller Primary Care  Provider: [facility], [place] Other Clinician: Referring Provider: Treating Provider/Extender:Robson, Myra Rude, [place] Weeks in Treatment: 10 History of Present Illness HPI Description: 53 year old male who fell off his truck 15 days ago, was seen in ED for right anterior leg wound 3 days after his fall and has been using an antibiotic cream, with dressing, was given a course of oral Keflex, and presents to the wound clinic for further care. 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 Patient was seen at urgent care and recommended mupirocin for 4 days. But he has been using an antibiotic cream along with dry dressing pads, the swelling was actually worse last week and has been coming down in the right leg, he has discomfort but not a whole lot of pain as he has been working with not much discomfort to the leg. He denies any fevers chills shakes or redness in the leg No notable or significant medical history No tobacco abuse history Medical history includes hypertension for which he takes losartan, history of renal cell cancer status post right nephrectomy ABI 1.32 on right 8/20; this is a patient who had a traumatic wound to the anterior right mid tibia area. He has some degree of chronic venous insufficiency. 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 He clearly cannot do this at this point. We have been using silver alginate. The wound was evacuated fully last time he has a probing tunnel at 11:00 8/27; traumatic wound to the  right mid tibia  in the setting of some degree of chronic venous insufficiency and secondary edema. We use silver alginate. He has a probing tunnel superiorly that went from 3.8-3.3 this time. The rest of the wound surface looks fairly healthy 9/4; traumatic wound to the right mid tibia in the setting of some degree of chronic venous insufficiency. The probing tunnel superiorly is down to 2.5 cm. The rest of the wound looks healthy with granulation that appears to be fully filling in the wound. We are using Hydrofera Blue *Dimensions are 3.4 x 2.1 x 0.9 with a tunnel at 2.4 superiorly at 12:00 9/15; traumatic wound in the right mid tibia in the setting of chronic venous insufficiency. Probing tunnel is down to 1-1/2 cm. The rest of this is healthy and appears to be filling in. We are using Hydrofera Blue 9/22; traumatic wound in the right mid tibia in the setting of chronic venous insufficiency. Unfortunately the tunnel is still at 1.8 cm today although the rest of the surface area of the wound more superficially is a lot better. Change the dressing from [facility] to endoform today hoping to get this tunnel to close 10/1; traumatic wound in the right mid tibia in the setting of chronic venous insufficiency. Miraculously the tunnel is totally closed down today. The rest of his wound is epithelialized. Wound area is just at the tip of where the tunnel was. There is still probably 2 mm of depth here. We used endoform last week 10/9; tunnel is still closed wound is superficial but did not epithelialize as much as I might of like. I changed him from endoform back to [facility] 10/16; tunnel is closed. Area is slightly hyper granulated. I been using Hydrofera Blue 10/23; patient arrived with a very odd presentation to his wound. This almost look like adherent Hydrofera Blue. He had not had any trauma and vehemently denies any even incidental trauma Electronic Signature(s) Signed: 05/23/2019  6:06:00 PM By: Baltazar Najjar MD Entered By: Baltazar Najjar on 05/23/2019 10:56:36 -------------------------------------------------------------------------------- Physical Exam Details Patient Name: Date of Service: [name], [name] 05/23/2019 9:45 AM Medical Record [id number] Patient Account Number: [id number] Date of Birth/Sex: Treating RN: Jun 06, 1966 (53 y.o. Elizebeth Koller Primary Care Provider: [facility], [place] Other Clinician: Referring Provider: Treating Provider/Extender:Robson, Myra Rude, [place] Weeks in Treatment: 10 Constitutional Patient is hypertensive.. Pulse regular and within target range for patient.Marland Kitchen Respirations regular, non-labored and within target range.. Temperature is normal and within the target range for the patient.Marland Kitchen Appears in no distress. Notes Wound exam; debridement around the wound last time with a #3 curette. Arrives with a very odd presentation of Hydrofera Blue adherent to the wound with a hematoma. He may have had bleeding after my debridement last week that was not well recognized. Using a #5 curette I removed the Hydrofera Blue underlying eschar and some subcutaneous tissue. Paradoxically things clean up quite nicely the wound is small with advancing epithelialization. No evidence of surrounding infection Electronic Signature(s) Signed: 05/23/2019 6:06:00 PM By: Baltazar Najjar MD Entered By: Baltazar Najjar on 05/23/2019 10:57:53 -------------------------------------------------------------------------------- Physician Orders Details Patient Name: Date of Service: [name], [name] 05/23/2019 9:45 AM Medical Record [id number] Patient Account Number: [id number] Date of Birth/Sex: Treating RN: Aug 12, 1965 (53 y.o. Elizebeth Koller Primary Care Provider: [facility], [place] Other Clinician: Referring Provider: Treating Provider/Extender:Robson, Myra Rude, [place] Weeks in Treatment: 10 Verbal / Phone Orders: No Diagnosis  Coding ICD-10 Coding Code Description L97.212 Non-pressure chronic ulcer of right calf with fat layer exposed I87.2 Venous  insufficiency (chronic) (peripheral) Follow-up Appointments Return Appointment in 1 week. - Thursday AM Dressing Change Frequency Wound #1 Right,Anterior Lower Leg Other: - Do not change dressing. wound center to change. Skin Barriers/Peri-Wound Care Wound #1 Right,Anterior Lower Leg Barrier cream Moisturizing lotion Wound Cleansing Wound #1 Right,Anterior Lower Leg May shower with protection. Primary Wound Dressing Wound #1 Right,Anterior Lower Leg Polymem Secondary Dressing Wound #1 Right,Anterior Lower Leg ABD pad Edema Control 4 layer compression - Right Lower Extremity - apply unna boot at upper portion to lower leg to aid in holding compression wrap in place. Avoid standing for long periods of time Elevate legs to the level of the heart or above for 30 minutes daily and/or when sitting, a frequency of: - several times throughout the day. Additional Orders / Instructions Other: - Patient can return to work as long as he can sit and elevate right leg multiple times a day. Patient's leg cannot be dependent for long periods of time. Electronic Signature(s) Signed: 05/23/2019 5:59:58 PM By: Zandra Abts RN, BSN Signed: 05/23/2019 6:06:00 PM By: Baltazar Najjar MD Entered By: Zandra Abts on 05/23/2019 10:25:32 -------------------------------------------------------------------------------- Problem List Details Patient Name: Date of Service: [name], [name] 05/23/2019 9:45 AM Medical Record [id number] Patient Account Number: [id number] Date of Birth/Sex: Treating RN: Apr 25, 1966 (53 y.o. Elizebeth Koller Primary Care Provider: [facility], [place] Other Clinician: Referring Provider: Treating Provider/Extender:Robson, Myra Rude, [place] Weeks in Treatment: 10 Active Problems ICD-10 Evaluated Encounter Code Description Active Date Today  Diagnosis L97.212 Non-pressure chronic ulcer of right calf with fat layer 03/13/2019 No Yes exposed I87.2 Venous insufficiency (chronic) (peripheral) 03/13/2019 No Yes Inactive Problems Resolved Problems Electronic Signature(s) Signed: 05/23/2019 6:06:00 PM By: Baltazar Najjar MD Entered By: Baltazar Najjar on 05/23/2019 10:55:48 -------------------------------------------------------------------------------- Progress Note Details Patient Name: Date of Service: [name] 05/23/2019 9:45 AM Medical Record [id number] Patient Account Number: [id number] Date of Birth/Sex: Treating RN: 15-Oct-1965 (53 y.o. Elizebeth Koller Primary Care Provider: [facility], [place] Other Clinician: Referring Provider: Treating Provider/Extender:Robson, Myra Rude, [place] Weeks in Treatment: 10 Subjective History of Present Illness (HPI) 53 year old male who fell off his truck 15 days ago, was seen in ED for right anterior leg wound 3 days after his fall and has been using an antibiotic cream, with dressing, was given a course of oral Keflex, and presents to the wound clinic for further care. 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 Patient was seen at urgent care and recommended mupirocin for 4 days. But he has been using an antibiotic cream along with dry dressing pads, the swelling was actually worse last week and has been coming down in the right leg, he has discomfort but not a whole lot of pain as he has been working with not much discomfort to the leg. He denies any fevers chills shakes or redness in the leg No notable or  significant medical history No tobacco abuse history Medical history includes hypertension for which he takes losartan, history of renal cell cancer status post right nephrectomy ABI 1.32 on right 8/20;  this is a patient who had a traumatic wound to the anterior right mid tibia area. He has some degree of chronic venous insufficiency. 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 He clearly cannot do this at this point. We have been using silver alginate. The wound was evacuated fully last time he has a probing tunnel at 11:00 8/27; traumatic wound to the right mid tibia in the setting of some degree of chronic venous insufficiency and secondary edema. We use silver alginate. He has a probing tunnel superiorly that went from 3.8-3.3 this time. The rest of the wound surface looks fairly healthy 9/4; traumatic wound to the right mid tibia in the setting of some degree of chronic venous insufficiency. The probing tunnel superiorly is down to 2.5 cm. The rest of the wound looks healthy with granulation that appears to be fully filling in the wound. We are using Hydrofera Blue *Dimensions are 3.4 x 2.1 x 0.9 with a tunnel at 2.4 superiorly at 12:00 9/15; traumatic wound in the right mid tibia in the setting of chronic venous insufficiency. Probing tunnel is down to 1-1/2 cm. The rest of this is healthy and appears to be filling in. We are using Hydrofera Blue 9/22; traumatic wound in the right mid tibia in the setting of chronic venous insufficiency. Unfortunately the tunnel is still at 1.8 cm today although the rest of the surface area of the wound more superficially is a lot better. Change the dressing from [facility] to endoform today hoping to get this tunnel to close 10/1; traumatic wound in the right mid tibia in the setting of chronic venous insufficiency. Miraculously the tunnel is totally closed  down today. The rest of his wound is epithelialized. Wound area is just at the tip of where the tunnel was. There is still probably 2 mm of depth here. We used endoform last week 10/9; tunnel is still closed wound is superficial but did not epithelialize as much as I might of like. I changed him from endoform back to [facility] 10/16; tunnel is closed. Area is slightly hyper granulated. I been using Hydrofera Blue 10/23; patient arrived with a very odd presentation to his wound. This almost look like adherent Hydrofera Blue. He had not had any trauma and vehemently denies any even incidental trauma Objective Constitutional Patient is hypertensive.. Pulse regular and within target range for patient.Marland Kitchen Respirations regular, non-labored and within target range.. Temperature is normal and within the target range for the patient.Marland Kitchen Appears in no distress. Vitals Time Taken: 9:40 AM, Height: 69 in, Weight: 325 lbs, BMI: 48, Temperature: 98.6 F, Pulse: 61 bpm, Respiratory Rate: 20 breaths/min, Blood Pressure: 182/77 mmHg. General Notes: Wound exam; debridement around the wound last time with a #3 curette. Arrives with a very odd presentation of Hydrofera Blue adherent to the wound with a hematoma. He may have had bleeding after my debridement last week that was not well recognized. Using a #5 curette I removed the Hydrofera Blue underlying eschar and some subcutaneous tissue. Paradoxically things clean up quite nicely the wound is small with advancing epithelialization. No evidence of surrounding infection Integumentary (Hair, Skin) Wound #1 status is Open. Original cause of wound was Trauma. The wound is located on the Right,Anterior Lower Leg. The wound measures 0.5cm length x 0.5cm width x 0.1cm depth; 0.196cm^2 area and 0.02cm^3 volume. There is Fat Layer (Subcutaneous Tissue) Exposed exposed. There  is no tunneling or undermining noted. There is a none present amount of drainage noted. The  wound margin is flat and intact. There is no granulation within the wound bed. There is no necrotic tissue within the wound bed. Assessment Active Problems ICD-10 Non-pressure chronic ulcer of right calf with fat layer exposed Venous insufficiency (chronic) (peripheral) Procedures Wound #1 Pre-procedure diagnosis of Wound #1 is a Trauma, Other located on the Right,Anterior Lower Leg . There was a Selective/Open Wound Non-Viable Tissue Debridement with a total area of 0.25 sq cm performed by Maxwell Caulobson, [name] G., MD. With the following instrument(s): Curette to remove Non-Viable tissue/material. Material removed includes Eschar after achieving pain control using Lidocaine 4% Topical Solution. No specimens were taken. A time out was conducted at 10:22, prior to the start of the procedure. A Minimum amount of bleeding was controlled with Pressure. The procedure was tolerated well with a pain level of 0 throughout and a pain level of 0 following the procedure. Post Debridement Measurements: 0.5cm length x 0.5cm width x 0.1cm depth; 0.02cm^3 volume. Character of Wound/Ulcer Post Debridement is improved. Post procedure Diagnosis Wound #1: Same as Pre-Procedure Pre-procedure diagnosis of Wound #1 is a Trauma, Other located on the Right,Anterior Lower Leg . There was a Four Layer Compression Therapy Procedure by [name], [name], RN. Post procedure Diagnosis Wound #1: Same as Pre-Procedure Plan Follow-up Appointments: Return Appointment in 1 week. - Thursday AM Dressing Change Frequency: Wound #1 Right,Anterior Lower Leg: Other: - Do not change dressing. wound center to change. Skin Barriers/Peri-Wound Care: Wound #1 Right,Anterior Lower Leg: Barrier cream Moisturizing lotion Wound Cleansing: Wound #1 Right,Anterior Lower Leg: May shower with protection. Primary Wound Dressing: Wound #1 Right,Anterior Lower Leg: Polymem Secondary Dressing: Wound #1 Right,Anterior Lower Leg: ABD pad Edema  Control: 4 layer compression - Right Lower Extremity - apply unna boot at upper portion to lower leg to aid in holding compression wrap in place. Avoid standing for long periods of time Elevate legs to the level of the heart or above for 30 minutes daily and/or when sitting, a frequency of: - several times throughout the day. Additional Orders / Instructions: Other: - Patient can return to work as long as he can sit and elevate right leg multiple times a day. Patient's leg cannot be dependent for long periods of time. 1. I put polymen under his compression 2. Even after this odd presentation today which I think was probably bleeding under the Hydrofera Blue from my debridement last week/small hematoma everything looks really quite healthy here. There is no tunnel no evidence of surrounding infection Electronic Signature(s) Signed: 05/23/2019 6:06:00 PM By: Baltazar Najjarobson, [name] MD Entered By: Baltazar Najjarobson, [name] on 05/23/2019 10:58:43 -------------------------------------------------------------------------------- SuperBill Details Patient Name: Date of Service: Nicholas MunroFREEMAN, Nicholas E. 05/23/2019 Medical Record [id number] Patient Account Number: [id number] Date of Birth/Sex: Treating RN: 1966-03-26 (53 y.o. [name]) [name], [name] [facility], [place] Other Clinician: Referring Provider: Treating Provider/Extender:Robson, [name] PLLC, [place] Weeks in Treatment: 10 Diagnosis Coding ICD-10 Codes Code Description [phone] Non-pressure chronic ulcer of right calf with fat layer exposed I87.2 Venous insufficiency (chronic) (peripheral) Facility Procedures CPT4 Code Description: [id number] 97597 - DEBRIDE WOUND 1ST 20 SQ CM OR < ICD-10 Diagnosis Description L97.212 Non-pressure chronic ulcer of right calf with fat layer Modifier: exposed Quantity: 1 Physician Procedures CPT4 Code Description: [id number] 97597 - WC PHYS DEBR WO ANESTH 20 SQ CM ICD-10 Diagnosis Description L97.212  Non-pressure chronic ulcer of right calf with fat layer Modifier: exposed Quantity: 1 Electronic Signature(s)  Signed: 05/23/2019 6:06:00 PM By: Linton Ham MD Entered By: Linton Ham on 05/23/2019 10:58:59

## 2019-05-26 NOTE — Progress Notes (Addendum)
Nicholas, Golden ([id number]) Visit Report for 05/23/2019 Arrival Information Details Patient Name: Date of Service: [name], [name] 05/23/2019 9:45 AM Medical Record [id number] Patient Account Number: [id number] Date of Birth/Sex: Treating RN: 16-Feb-1966 (53 y.o. Lorette Ang, Meta.Reding Primary Care Tahani Potier: [facility], [place] Other Clinician: Referring Tiny Rietz: Treating Daionna Crossland/Extender:Robson, Vivia Budge, [place] Weeks in Treatment: 10 Visit Information History Since Last Visit Added or deleted any medications: No Patient Arrived: Ambulatory Any new allergies or adverse reactions: No Arrival Time: 09:37 Had a fall or experienced change in No Accompanied By: self activities of daily living that may affect Transfer Assistance: None risk of falls: Patient Identification Verified: Yes Signs or symptoms of abuse/neglect since last No Secondary Verification Process Completed: Yes visito Patient Requires Transmission-Based No Hospitalized since last visit: No Precautions: Implantable device outside of the clinic excluding No Patient Has Alerts: No cellular tissue based products placed in the center since last visit: Has Dressing in Place as Prescribed: Yes Has Compression in Place as Prescribed: Yes Pain Present Now: No Electronic Signature(s) Signed: 05/26/2019 5:31:38 PM By: Deon Pilling Entered By: Deon Pilling on 05/23/2019 09:38:15 -------------------------------------------------------------------------------- Compression Therapy Details Patient Name: Date of Service: [name], [name] 05/23/2019 9:45 AM Medical Record [id number] Patient Account Number: [id number] Date of Birth/Sex: Treating RN: 03/16/1966 (53 y.o. [name] Primary Care Florina Glas: [facility] : [facility], [place] Other Clinician: Referring Seila Liston: Treating Imunique Samad/Extender:Robson, Vivia Budge, [place] Weeks in Treatment: 10 Compression Therapy Performed for Wound Wound #1 Right,Anterior Lower  Leg Assessment: Performed By: Clinician Levan Hurst, RN Compression Type: Four Layer Post Procedure Diagnosis Same as Pre-procedure Electronic Signature(s) Signed: 05/23/2019 5:59:58 PM By: Levan Hurst RN, BSN Entered By: Levan Hurst on 05/23/2019 10:24:46 -------------------------------------------------------------------------------- Encounter Discharge Information Details Patient Name: Date of Service: [name], [name] 05/23/2019 9:45 AM Medical Record [id number] Patient Account Number: [id number] Date of Birth/Sex: Treating RN: 21-Aug-1965 (53 y.o. Marvis Repress Primary Care Arlin Savona: [facility], [place] Other Clinician: Referring Kissy Cielo: Treating Emonte Dieujuste/Extender:Robson, Vivia Budge, [place] Weeks in Treatment: 10 Encounter Discharge Information Items Post Procedure Vitals Discharge Condition: Stable Temperature (F): 98.6 Ambulatory Status: Ambulatory Pulse (bpm): 61 Discharge Destination: Home Respiratory Rate (breaths/min): 20 Transportation: Private Auto Blood Pressure (mmHg): 182/77 Accompanied By: self Schedule Follow-up Appointment: Yes Clinical Summary of Care: Patient Declined Electronic Signature(s) Signed: 05/26/2019 5:21:50 PM By: Kela Millin Entered By: Kela Millin on 05/23/2019 10:51:05 -------------------------------------------------------------------------------- Lower Extremity Assessment Details Patient Name: Date of Service: [name], [name] 05/23/2019 9:45 AM Medical Record [id number] Patient Account Number: [id number] Date of Birth/Sex: Treating RN: 1966/04/13 (53 y.o. Hessie Diener Primary Care Eleftheria Taborn: [facility], [place] Other Clinician: Referring Bobbie Valletta: Treating Takeru Bose/Extender:Robson, Vivia Budge, [place] Weeks in Treatment: 10 Edema Assessment Assessed: [Left: No] [Right: Yes] Edema: [Left: N] [Right: o] Calf Left: Right: Point of Measurement: 32 cm From Medial Instep cm 44 cm Ankle Left:  Right: Point of Measurement: 10 cm From Medial Instep cm 25 cm Vascular Assessment Pulses: Dorsalis Pedis Palpable: [Right:Yes] Electronic Signature(s) Signed: 05/26/2019 5:31:38 PM By: Deon Pilling Entered By: Deon Pilling on 05/23/2019 09:38:36 -------------------------------------------------------------------------------- Multi Wound Chart Details Patient Name: Date of Service: [name] 05/23/2019 9:45 AM Medical Record [id number] Patient Account Number: [id number] Date of Birth/Sex: Treating RN: 12-23-1965 (53 y.o. [name] Primary Care Rosaura Bolon: [facility], [place] Other Clinician: Referring Gottlieb Zuercher: Treating Grady Mohabir/Extender:Robson, Vivia Budge, [place] Weeks in Treatment: 10 Vital Signs Height(in): 69 Pulse(bpm): 61 Weight(lbs): 325 Blood Pressure(mmHg): 182/77 Body Mass Index(BMI): 48 Temperature(F): 98.6 Respiratory 20 Rate(breaths/min): Photos: [1:No Photos] [N/A:N/A] Wound Location: [1:Right Lower Leg -  Anterior] [N/A:N/A] Wounding Event: [1:Trauma] [N/A:N/A] Primary Etiology: [1:Trauma, Other] [N/A:N/A] Comorbid History: [1:Hypertension] [N/A:N/A] Date Acquired: [1:02/25/2019] [N/A:N/A] Weeks of Treatment: [1:10] [N/A:N/A] Wound Status: [1:Open] [N/A:N/A] Measurements L x W x D [1:0.5x0.5x0.1] [N/A:N/A] (cm) Area (cm) : [1:0.196] [N/A:N/A] Volume (cm) : [1:0.02] [N/A:N/A] % Reduction in Area: [1:98.90%] [N/A:N/A] % Reduction in Volume: [1:99.90%] [N/A:N/A] Classification: [1:Full Thickness Without Exposed Support Structures] [N/A:N/A] Exudate Amount: [1:None Present] [N/A:N/A] Wound Margin: [1:Flat and Intact] [N/A:N/A] Granulation Amount: [1:None Present (0%)] [N/A:N/A] Necrotic Amount: [1:None Present (0%)] [N/A:N/A] Exposed Structures: [1:Fat Layer (Subcutaneous Tissue) Exposed: Yes Fascia: No Tendon: No Muscle: No Joint: No Bone: No] [N/A:N/A] Epithelialization: [1:Large (67-100%)] [N/A:N/A] Debridement: [1:Debridement -  Selective/Open Wound] [N/A:N/A] Pre-procedure [1:10:22] [N/A:N/A] Verification/Time Out Taken: Pain Control: [1:Lidocaine 4% Topical Solution] [N/A:N/A] Tissue Debrided: [1:Necrotic/Eschar] [N/A:N/A] Level: [1:Non-Viable Tissue] [N/A:N/A] Debridement Area (sq cm):0.25 [N/A:N/A] Instrument: [1:Curette] [N/A:N/A] Bleeding: [1:Minimum] [N/A:N/A] Hemostasis Achieved: [1:Pressure] [N/A:N/A] Procedural Pain: [1:0] [N/A:N/A] Post Procedural Pain: [1:0] [N/A:N/A] Debridement Treatment Procedure was tolerated [N/A:N/A] Response: [1:well] Post Debridement [1:0.5x0.5x0.1] [N/A:N/A] Measurements L x W x D (cm) Post Debridement [1:0.02] [N/A:N/A] Volume: (cm) Procedures Performed: Compression Therapy [1:Debridement] [N/A:N/A] Treatment Notes Wound #1 (Right, Anterior Lower Leg) 1. Cleanse With Wound Cleanser Soap and water 2. Periwound Care Barrier cream Moisturizing lotion 3. Primary Dressing Applied Polymem 4. Secondary Dressing ABD Pad Foam 6. Support Layer Applied 4 layer compression wrap Notes netting. foam to ankle for protection and unna boot to upper portion of lower leg Electronic Signature(s) Signed: 05/23/2019 5:59:58 PM By: Zandra Abts RN, BSN Signed: 05/23/2019 6:06:00 PM By: Baltazar Najjar MD Entered By: Baltazar Najjar on 05/23/2019 10:56:04 -------------------------------------------------------------------------------- Multi-Disciplinary Care Plan Details Patient Name: Date of Service: [name], [name] 05/23/2019 9:45 AM Medical Record [id number] Patient Account Number: [id number] Date of Birth/Sex: Treating RN: 31-Jan-1966 (53 y.o. Elizebeth Koller Primary Care Shahrzad Koble: [facility], [place] Other Clinician: Referring Tinzley Dalia: Treating Tristan Proto/Extender:Robson, Myra Rude, [place] Weeks in Treatment: 10 Active Inactive Nutrition Nursing Diagnoses: Potential for alteratiion in Nutrition/Potential for imbalanced nutrition Goals: Patient/caregiver  agrees to and verbalizes understanding of need to obtain nutritional consultation Date Initiated: 03/13/2019 Target Resolution Date: 05/30/2019 Goal Status: Active Interventions: Provide education on nutrition Treatment Activities: Education provided on Nutrition : 03/27/2019 Patient referred to Primary Care Physician for further nutritional evaluation : 03/13/2019 Notes: Wound/Skin Impairment Nursing Diagnoses: Knowledge deficit related to ulceration/compromised skin integrity Goals: Patient/caregiver will verbalize understanding of skin care regimen Date Initiated: 03/13/2019 Target Resolution Date: 05/30/2019 Goal Status: Active Ulcer/skin breakdown will heal within 14 weeks Date Initiated: 03/13/2019 Target Resolution Date: 07/04/2019 Goal Status: Active Interventions: Assess patient/caregiver ability to obtain necessary supplies Assess patient/caregiver ability to perform ulcer/skin care regimen upon admission and as needed Assess ulceration(s) every visit Provide education on ulcer and skin care Treatment Activities: Skin care regimen initiated : 03/13/2019 Topical wound management initiated : 03/13/2019 Notes: Electronic Signature(s) Signed: 05/23/2019 5:59:58 PM By: Zandra Abts RN, BSN Entered By: Zandra Abts on 05/23/2019 09:39:55 -------------------------------------------------------------------------------- Pain Assessment Details Patient Name: Date of Service: DANIIL, LABARGE 05/23/2019 9:45 AM Medical Record [id number] Patient Account Number: [id number] Date of Birth/Sex: Treating RN: 08/27/1965 (53 y.o. [name] Primary Care Briunna Leicht: [facility], [place] Other Clinician: Referring Cathy Ropp: Treating Zalea Pete/Extender:Robson, Myra Rude, [place] Weeks in Treatment: 10 Active Problems Location of Pain Severity and Description of Pain Patient Has Paino No Site Locations Rate the pain. Current Pain Level: 0 Pain Management and Medication Current  Pain Management: Medication: No Cold Application: No Rest: No Massage: No Activity: No  T.GoldenN.S.: No Heat Application: No Leg drop or elevation: No Is the Current Pain Management Adequate: Adequate How does your wound impact your activities of daily livingo Sleep: No Bathing: No Appetite: No Relationship With Others: No Bladder Continence: No Emotions: No Bowel Continence: No Work: No Toileting: No Drive: No Dressing: No Hobbies: No Electronic Signature(s) Signed: 05/26/2019 5:31:38 PM By: Shawn Stalleaton, Bobbi Entered By: Shawn Stalleaton, Bobbi on 05/23/2019 09:38:26 -------------------------------------------------------------------------------- Patient/Caregiver Education Details Patient Name: Date of Service: [name], Jesten E. 10/23/2020andnbsp9:45 AM Medical Record [id number] Patient Account Number: [id number] Date of Birth/Gender: [birth year] (53 y.o. M) Treating RN: [name], [name] [facility], [place] Other Clinician: Referring Physician: Treating Physician/Extender:Robson, [name], [place] Weeks in Treatment: 10 Education Assessment Education Provided To: Patient Education Topics Provided Wound/Skin Impairment: Methods: Explain/Verbal Responses: State content correctly Electronic Signature(s) Signed: 05/23/2019 5:59:58 PM By: [name], [name] RN, BSN Entered By: [name], [name] on 05/23/2019 09:40:30 -------------------------------------------------------------------------------- Wound Assessment Details Patient Name: Date of Service: [name], Nicholas E. 05/23/2019 9:45 AM Medical Record [id number] Patient Account Number: [id number] Date of Birth/Sex: Treating RN: [birth year] (53 y.o. Tammy SoursM) Deaton, Bobbi Primary Care Lainey Nelson: [facility], [place] Other Clinician: Referring Franke Menter: Treating Loretha Ure/Extender:Robson, [name], [place] Weeks in Treatment: 10 Wound Status Wound Number: 1 Primary Etiology: Trauma, Other Wound Location: Right Lower  Leg - Anterior Wound Status: Open Wounding Event: Trauma Comorbid History: Hypertension Date Acquired: 02/25/2019 Weeks Of Treatment: 10 Clustered Wound: No Photos Wound Measurements Length: (cm) 0.5 % Re Width: (cm) 0.5 % Re Depth: (cm) 0.1 Epit Area: (cm) 0.196 Tun Volume: (cm) 0.02 Und Wound Description Classification: Full Thickness Without Exposed Support Structures Wound Flat and Intact Margin: Exudate None Present Amount: Wound Bed Granulation Amount: None Present (0%) Necrotic Amount: None Present (0%) Foul Odor After Cleansing: No Slough/Fibrino Yes Exposed Structure Fascia Exposed: No Fat Layer (Subcutaneous Tissue) Exposed: Yes Tendon Exposed: No Muscle Exposed: No Joint Exposed: No Bone Exposed: No duction in Area: 98.9% duction in Volume: 99.9% helialization: Large (67-100%) neling: No ermining: No Treatment Notes Wound #1 (Right, Anterior Lower Leg) 1. Cleanse With Wound Cleanser Soap and water 2. Periwound Care Barrier cream Moisturizing lotion 3. Primary Dressing Applied Polymem 4. Secondary Dressing ABD Pad Foam 6. Support Layer Applied 4 layer compression wrap Notes netting. foam to ankle for protection and unna boot to upper portion of lower leg Electronic Signature(s) Signed: 05/27/2019 3:44:28 PM By: Benjaman KindlerJones, Dedrick EMT/HBOT Signed: 05/27/2019 5:02:12 PM By: Shawn Stalleaton, Bobbi Previous Signature: 05/26/2019 5:31:38 PM Version By: Shawn Stalleaton, Bobbi Entered By: Benjaman KindlerJones, Dedrick on 05/27/2019 12:21:24 -------------------------------------------------------------------------------- Vitals Details Patient Name: Date of Service: [name], Nicholas E. 05/23/2019 9:45 AM Medical Record [id number] Patient Account Number: [id number] Date of Birth/Sex: Treating RN: [birth year] (53 y.o. Tammy SoursM) Deaton, Bobbi Primary Care [facility]ail Boyte[facility], [place] Other Clinician: Referring Maddy Graham: Treating Corbitt Cloke/Extender:Robson, [name], [place] Weeks in  Treatment: 10 Vital Signs Time Taken: 09:40 Temperature (F): 98.6 Height (in): 69 Pulse (bpm): 61 Weight (lbs): 325 Respiratory Rate (breaths/min): 20 Body Mass Index (BMI): 48 Blood Pressure (mmHg): 182/77 Reference Range: 80 - 120 mg / dl Electronic Signature(s) Signed: 05/26/2019 5:31:38 PM By: Shawn Stalleaton, Bobbi Entered By: Shawn Stalleaton, Bobbi on 05/23/2019 09:40:10

## 2019-05-29 ENCOUNTER — Encounter (HOSPITAL_BASED_OUTPATIENT_CLINIC_OR_DEPARTMENT_OTHER): Payer: Worker's Compensation | Admitting: Internal Medicine

## 2019-05-29 ENCOUNTER — Other Ambulatory Visit: Payer: Self-pay

## 2019-05-29 DIAGNOSIS — L97212 Non-pressure chronic ulcer of right calf with fat layer exposed: Secondary | ICD-10-CM | POA: Diagnosis not present

## 2019-06-02 NOTE — Progress Notes (Signed)
THOMPSON, MCKIM ([id number]) Visit Report for 05/29/2019 HPI Details Patient Name: Date of Service: [name], [name] 05/29/2019 8:15 AM Medical Record [id number] Patient Account Number: [id number] Date of Birth/Sex: Treating RN: 1966-06-11 (54 y.o. [name] Primary Care Provider: [facility], [place] Other Clinician: Referring Provider: Treating Provider/Extender:, [name], [place] Weeks in Treatment: 11 History of Present Illness HPI Description: 53 year old male who fell off his truck 15 days ago, was seen in ED for right anterior leg wound 3 days after his fall and has been using an antibiotic cream, with dressing, was given a course of oral Keflex, and presents to the wound clinic for further care. 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 Patient was seen at urgent care and recommended mupirocin for 4 days. But he has been using an antibiotic cream along with dry dressing pads, the swelling was actually worse last week and has been coming down in the right leg, he has discomfort but not a whole lot of pain as he has been working with not much discomfort to the leg. He denies any fevers chills shakes or redness in the leg No notable or significant medical history No tobacco abuse history Medical history includes hypertension for which he takes losartan, history of renal cell cancer status post right nephrectomy ABI 1.32 on right 8/20; this is a patient who had a traumatic wound to the anterior right mid tibia area. He has some degree of chronic venous insufficiency. 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 He clearly cannot do this at this point. We have been using silver alginate. The wound was evacuated fully last time he has a probing tunnel at 11:00 8/27; traumatic wound to the right mid tibia in the setting of some degree of chronic venous insufficiency and secondary edema. We use silver alginate. He has a probing tunnel superiorly that went from 3.8-3.3 this time. The rest of the wound surface looks fairly healthy 9/4; traumatic wound to the right mid tibia in the setting of some degree of chronic venous insufficiency. The probing tunnel superiorly is down to 2.5 cm. The rest of the wound looks healthy with granulation that appears to be fully filling in the wound. We are using Hydrofera Blue *Dimensions are 3.4 x 2.1 x 0.9 with a tunnel at 2.4 superiorly at 12:00 9/15; traumatic wound in the right mid tibia in the setting of chronic venous insufficiency. Probing tunnel is down to 1-1/2 cm. The rest of this is healthy and appears to be filling in. We are using Hydrofera Blue 9/22; traumatic wound in the right mid tibia in the setting of chronic venous insufficiency. Unfortunately the tunnel is still at 1.8 cm today although the rest of the surface area of the wound more superficially is a lot better. Change the dressing from [facility] to endoform today hoping to get this tunnel to close 10/1; traumatic wound in the right mid tibia in the setting  of chronic venous insufficiency. Miraculously the tunnel is totally closed down today. The rest of his wound is epithelialized. Wound area is just at the tip of where the tunnel was. There is still probably 2 mm of depth here. We used endoform last week 10/9; tunnel is still closed wound is superficial but did not epithelialize as much as I might of like. I changed him [facility]; tunnel is closed. Area is  slightly hyper granulated. I been using Hydrofera Blue 10/23; patient arrived with a very odd presentation to his wound. This almost look like adherent Hydrofera Blue. He had not had any trauma and vehemently denies any even incidental trauma 10/29; the patient's wound is closed. He has some irritation around the wound which I think is some form of contact dermatitis. We will put TCA on this today. He has some degree of chronic venous insufficiency and we have recommended 20/30 below-knee stockings. We have given him the [place] of [place] and given him his measurements. He has had various traumatic wounds on his legs in the past Electronic Signature(s) Signed: 06/02/2019 8:24:05 AM By: Nicholas Najjarobson,  MD Entered By: Nicholas Najjarobson,  on 05/29/2019 09:00:51 -------------------------------------------------------------------------------- Physical Exam Details Patient Name: Date of Service: [name], Nicholas E. 05/29/2019 8:15 AM Medical Record [id number] Patient Account Number: [id number] Date of Birth/Sex: Treating RN: 05-25-1966 (53 y.o. [name]) Deaton, [name] [facility], [place] Other Clinician: Referring Provider: Treating Provider/Extender:, [name], [place] Weeks in Treatment: 11 Constitutional Patient is hypertensive.. Pulse regular and within target range for patient.Marland Kitchen. Respirations regular, non-labored and within target range.. Temperature is normal and within the target range for the patient.Marland Kitchen. Appears in no distress. Notes Wound exam; last week the patient had a small hematoma under the hip Hydrofera Blue. Things have completely epithelialized this week after debridement last week. He has some irritation around the wound which I think is either occlusive skin rash or possibly contact dermatitis weekly TCA on this Electronic Signature(s) Signed: 06/02/2019 8:24:05 AM By: Nicholas Najjarobson,  MD Entered By: Nicholas Najjarobson,  on  05/29/2019 09:01:52 -------------------------------------------------------------------------------- Physician Orders Details Patient Name: Date of Service: [name], Phu E. 05/29/2019 8:15 AM Medical Record [id number] Patient Account Number: [id number] Date of Birth/Sex: Treating RN: 05-25-1966 (53 y.o. [name]) Deaton, [name] [facility], [place] Other Clinician: Referring Provider: Treating Provider/Extender:, [name], [place] Weeks in Treatment: 1311 Verbal / Phone Orders: No Diagnosis Coding [facility] Center - call if any future wound care needs. Skin Barriers/Peri-Wound Care Moisturizing lotion - daily to both legs at bedtime. TCA Cream or Ointment - apply cream in clinic today. Edema Control Support Garment 20-30 mm/Hg pressure to: - Patient to purchase from elastic therapy. Call and company will ship stockings to home. Patient to apply in the morning and remove at night. Additional Orders / Instructions Other: - Patient is able to go back to work with no restrictions. Electronic Signature(s) Signed: 05/29/2019 3:04:52 PM By: Shawn Stalleaton, [name] Signed: 06/02/2019 8:24:05 AM By: Nicholas Najjarobson,  MD Entered By: Shawn Stalleaton, [name] on 05/29/2019 08:42:39 -------------------------------------------------------------------------------- Problem List Details Patient Name: Date of Service: [name], Shahmeer E. 05/29/2019 8:15 AM Medical Record [id number] Patient Account Number: [id number] Date of Birth/Sex: Treating RN: 05-25-1966 (53 y.o. 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 Nicholas Sours Primary Care Provider: [facility], [place] Other Clinician: Referring Provider: Treating Provider/Extender:, [name], [place] Weeks in Treatment: 11 Subjective History of Present Illness (HPI) 53 year old male who fell off his truck 15 days ago, was seen in ED for right anterior leg wound 3 days after his fall and has been using an antibiotic cream, with dressing, was given a course of oral Keflex, and presents to the wound clinic for further care. 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 Patient was seen at urgent care and recommended mupirocin for 4 days. But he has been using an antibiotic cream along with dry dressing pads, the swelling was actually worse last week and has been coming down in the right leg, he has discomfort but not a whole lot of pain as he has been working with not much discomfort to the leg. He denies any fevers chills shakes or redness in the leg No notable or significant medical history No tobacco abuse history Medical history includes hypertension for which he takes losartan, history  of renal cell cancer status post right nephrectomy ABI 1.32 on right 8/20; this is a patient who had a traumatic wound to the anterior right mid tibia area. He has some degree of chronic venous insufficiency. 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 He clearly cannot do this at this point. We have been using silver alginate. The wound was evacuated fully last time he has a probing tunnel at 11:00 8/27; traumatic wound to the right mid tibia in the setting of some degree of chronic venous insufficiency and secondary edema. We use silver alginate. He has a probing tunnel superiorly that went from 3.8-3.3 this time. The rest of the wound surface looks fairly healthy 9/4; traumatic wound to the right mid tibia in the setting of some degree of chronic venous insufficiency. The probing tunnel superiorly is down to 2.5 cm. The rest of the wound looks healthy with granulation that appears to be fully filling in the wound. We are using Hydrofera Blue *Dimensions are 3.4 x 2.1 x 0.9 with a tunnel at 2.4 superiorly at 12:00 9/15; traumatic wound in the right mid tibia in the setting of chronic venous insufficiency. Probing tunnel is down to 1-1/2 cm. The rest of this is healthy and appears to be filling in. We are using Hydrofera Blue 9/22; traumatic wound in the right mid tibia in the setting of chronic venous insufficiency. Unfortunately the tunnel is still at 1.8 cm today although the rest of the surface area of the wound  more superficially is a lot better. Change the dressing from [facility] to endoform today hoping to get this tunnel to close 10/1; traumatic wound in the right mid tibia in the setting of chronic venous insufficiency. Miraculously the tunnel is totally closed down today. The rest of his wound is epithelialized. Wound area is just at the tip of where the tunnel was. There is still probably  2 mm of depth here. We used endoform last week 10/9; tunnel is still closed wound is superficial but did not epithelialize as much as I might of like. I changed him from endoform back to [facility] 10/16; tunnel is closed. Area is slightly hyper granulated. I been using Hydrofera Blue 10/23; patient arrived with a very odd presentation to his wound. This almost look like adherent Hydrofera Blue. He had not had any trauma and vehemently denies any even incidental trauma 10/29; the patient's wound is closed. He has some irritation around the wound which I think is some form of contact dermatitis. We will put TCA on this today. He has some degree of chronic venous insufficiency and we have recommended 20/30 below-knee stockings. We have given him the address of elastic therapy in [facility] and given him his measurements. He has had various traumatic wounds on his legs in the past Objective Constitutional Patient is hypertensive.. Pulse regular and within target range for patient.Marland Kitchen Respirations regular, non-labored and within target range.. Temperature is normal and within the target range for the patient.Marland Kitchen Appears in no distress. Vitals Time Taken: 8:05 AM, Height: 69 in, Weight: 325 lbs, BMI: 48, Temperature: 98.4 F, Pulse: 65 bpm, Respiratory Rate: 18 breaths/min, Blood Pressure: 148/75 mmHg. General Notes: Wound exam; last week the patient had a small hematoma under the hip Hydrofera Blue. Things have completely epithelialized this week after debridement last week. He has some irritation around the wound which I think is either occlusive skin rash or possibly contact dermatitis weekly TCA on this Integumentary (Hair, Skin) Wound #1 status is Open. Original cause of wound was Trauma. The wound is located on the Right,Anterior Lower Leg. The wound measures 0cm length x 0cm width x 0cm depth; 0cm^2 area and 0cm^3 volume. There is no tunneling or undermining noted. There is a  none present amount of drainage noted. The wound margin is flat and intact. There is no granulation within the wound bed. There is no necrotic tissue within the wound bed. Assessment Active Problems ICD-10 Non-pressure chronic ulcer of right calf with fat layer exposed Venous insufficiency (chronic) (peripheral) Plan Discharge From [facility] Services: Discharge from Wound Care Center - call if any future wound care needs. Skin Barriers/Peri-Wound Care: Moisturizing lotion - daily to both legs at bedtime. TCA Cream or Ointment - apply cream in clinic today. Edema Control: Support Garment 20-30 mm/Hg pressure to: - Patient to purchase from elastic therapy. Call and company will ship stockings to home. Patient to apply in the morning and remove at night. Additional Orders / Instructions: Other: - Patient is able to go back to work with no restrictions. 1. We have suggested 20/30 below-knee stockings from elastic therapy. The patient lives in [place] where this business is located. We have given him his measurements 2. TCA cream to the area around the periwound. I think this will probably resolve the issue along with discontinuing the wrap on his leg. Otherwise I would suggest hydrocortisone 3. The patient is cleared to return to work without restrictions. Electronic Signature(s) Signed: 06/02/2019 8:24:05 AM By:  Linton Ham MD Entered By: Linton Ham on 05/29/2019 09:02:54 -------------------------------------------------------------------------------- SuperBill Details Patient Name: Date of Service: PAULMICHAEL, SCHRECK 05/29/2019 Medical Record [id number] Patient Account Number: [id number] Date of Birth/Sex: Treating RN: 1965-08-01 (53 y.o. Hessie Diener Primary Care Provider: [facility], [place] Other Clinician: Referring Provider: Treating Provider/Extender:, Vivia Budge, [place] Weeks in Treatment: 11 Diagnosis Coding ICD-10 Codes Code Description L97.212  Non-pressure chronic ulcer of right calf with fat layer exposed I87.2 Venous insufficiency (chronic) (peripheral) Facility Procedures CPT4 Code: [id number] Description: [place] VISIT-LEV 3 EST PT Modifier: Quantity: 1 Physician Procedures CPT4 Code: 0109323 Description: 55732 - WC PHYS LEVEL 2 - EST PT ICD-10 Diagnosis Description K02.542 Non-pressure chronic ulcer of right calf with fat lay I87.2 Venous insufficiency (chronic) (peripheral) Modifier: er exposed Quantity: 1 Electronic Signature(s) Signed: 06/02/2019 8:24:05 AM By: Linton Ham MD Entered By: Linton Ham on 05/29/2019 09:03:05

## 2019-07-08 NOTE — Progress Notes (Signed)
[name], Nicholas E. ([id number]) Visit Report for 05/16/2019 Arrival Information Details Patient Name: Date of Service: [name], Nicholas E. 05/16/2019 9:15 AM Medical Record [id number] Patient Account Number: [id number] Date of Birth/Sex: Treating RN: 14-Aug-1965 (53 y.o. [name]) Golden, Nicholas Primary Care [name]: [facility], [place] Other Clinician: Referring [name]: Treating [name]/Extender:[name], [place] Weeks in Treatment: 9 Visit Information History Since Last Visit Added or deleted any medications: No Patient Arrived: Ambulatory Any new allergies or adverse reactions: No Arrival Time: 09:19 Had a fall or experienced change in No Accompanied By: self activities of daily living that may affect Transfer Assistance: None risk of falls: Patient Identification Verified: Yes Signs or symptoms of abuse/neglect since last No Secondary Verification Process Completed: Yes visito Patient Requires Transmission-Based No Hospitalized since last visit: No Precautions: Implantable device outside of the clinic excluding No Patient Has Alerts: No cellular tissue based products placed in the center since last visit: Has Dressing in Place as Prescribed: Yes Pain Present Now: No Electronic Signature(s) Signed: 07/08/2019 3:02:15 PM By: [name], [name] Entered By: [name], [name] on 05/16/2019 09:19:56 -------------------------------------------------------------------------------- Compression Therapy Details Patient Name: Date of Service: [name], Nicholas E. 05/16/2019 9:15 AM Medical Record [id number] Patient Account Number: [id number] Date of Birth/Sex: Treating RN: 14-Aug-1965 (53 y.o. [name]) Golden, Nicholas Primary Care Malaiah Viramontes: [facility], [place] Other Clinician: Referring Lashannon Bresnan: Treating Avleen Bordwell/Extender:[name], [place] Weeks in Treatment: 9 Compression Therapy Performed for Wound Wound #1 Right,Anterior Lower Leg Assessment: Performed By: Clinician [name],  [name], RN Compression Type: Four Layer Post Procedure Diagnosis Same as Pre-procedure Electronic Signature(s) Signed: 05/16/2019 6:00:55 PM By: [name], [name] RN, BSN Entered By: [name], Nicholas on 05/16/2019 10:03:34 -------------------------------------------------------------------------------- Lower Extremity Assessment Details Patient Name: Date of Service: [name], Nicholas E. 05/16/2019 9:15 AM Medical Record [id number] Patient Account Number: [id number] Date of Birth/Sex: Treating RN: 14-Aug-1965 (53 y.o. [name]) Golden, Nicholas Primary Care Dea Bitting: [facility], [place] Other Clinician: Referring Ervie Mccard: Treating Glynda Soliday/Extender:[name], [place] Weeks in Treatment: 9 Edema Assessment Assessed: [Left: No] [Right: No] Edema: [Left: Ye] [Right: s] Calf Left: Right: Point of Measurement: 32 cm From Medial Instep cm 45 cm Ankle Left: Right: Point of Measurement: 10 cm From Medial Instep cm 24 cm Vascular Assessment Pulses: Dorsalis Pedis Palpable: [Right:Yes] Electronic Signature(s) Signed: 05/16/2019 6:00:55 PM By: [name], [name] RN, BSN Entered By: [name], Nicholas on 05/16/2019 10:02:39 -------------------------------------------------------------------------------- Multi Wound Chart Details Patient Name: Date of Service: [name], Nicholas E. 05/16/2019 9:15 AM Medical Record [id number] Patient Account Number: [id number] Date of Birth/Sex: Treating RN: 14-Aug-1965 (53 y.o. [name]) Golden, Nicholas Primary Care Aj Crunkleton: [facility], [place] Other Clinician: Referring Preethi Scantlebury: Treating Jozeph Persing/Extender:[name], [place] Weeks in Treatment: 9 Vital Signs Height(in): 69 Pulse(bpm): 63 Weight(lbs): 325 Blood Pressure(mmHg): 144/76 Body Mass Index(BMI): 48 Temperature(F): 98.5 Respiratory 20 Rate(breaths/min): Photos: [1:No Photos] [N/A:N/A] Wound Location: [1:Right, Anterior Lower Leg] [N/A:N/A] Wounding Event: [1:Trauma] [N/A:N/A] Primary Etiology:  [1:Trauma, Other] [N/A:N/A] Date Acquired: [1:02/25/2019] [N/A:N/A] Weeks of Treatment: [1:9] [N/A:N/A] Wound Status: [1:Open] [N/A:N/A] Measurements L x W x D [1:1x0.7x0.1] [N/A:N/A] (cm) Area (cm) : [1:0.55] [N/A:N/A] Volume (cm) : [1:0.055] [N/A:N/A] % Reduction in Area: [1:96.90%] [N/A:N/A] % Reduction in Volume: [1:99.80%] [N/A:N/A] Classification: [1:Full Thickness Without Exposed Support Structures] [N/A:N/A] Procedures Performed: [1:Chemical Cauterization Compression Therapy] [N/A:N/A] Treatment Notes Electronic Signature(s) Signed: 05/16/2019 5:33:39 PM By: Baltazar Najjarobson, [name] MD Signed: 05/16/2019 6:00:55 PM By: [name], [name] RN, BSN Entered By: Baltazar Najjarobson, [name] on 05/16/2019 10:13:09 -------------------------------------------------------------------------------- Multi-Disciplinary Care Plan Details Patient Name: Date of Service: [name], Nicholas E. 05/16/2019 9:15 AM Medical Record [id number] Patient Account  Number: [id number] Date of Birth/Sex: Treating RN: May 18, 1966 (53 y.o. [name] Primary Care [name]: [facility], [place] Other Clinician: Referring Verble Styron: Treating Graycee Greeson/Extender:Robson, Vivia Budge, [place] Weeks in Treatment: 9 Active Inactive Nutrition Nursing Diagnoses: Potential for alteratiion in Nutrition/Potential for imbalanced nutrition Goals: Patient/caregiver agrees to and verbalizes understanding of need to obtain nutritional consultation Date Initiated: 03/13/2019 Target Resolution Date: 05/30/2019 Goal Status: Active Interventions: Provide education on nutrition Treatment Activities: Education provided on Nutrition : 05/09/2019 Patient referred to Primary Care Physician for further nutritional evaluation : 03/13/2019 Notes: Wound/Skin Impairment Nursing Diagnoses: Knowledge deficit related to ulceration/compromised skin integrity Goals: Patient/caregiver will verbalize understanding of skin care regimen Date Initiated:  03/13/2019 Target Resolution Date: 05/30/2019 Goal Status: Active Ulcer/skin breakdown will heal within 14 weeks Date Initiated: 03/13/2019 Target Resolution Date: 07/04/2019 Goal Status: Active Interventions: Assess patient/caregiver ability to obtain necessary supplies Assess patient/caregiver ability to perform ulcer/skin care regimen upon admission and as needed Assess ulceration(s) every visit Provide education on ulcer and skin care Treatment Activities: Skin care regimen initiated : 03/13/2019 Topical wound management initiated : 03/13/2019 Notes: Electronic Signature(s) Signed: 05/16/2019 6:00:55 PM By: Levan Hurst RN, BSN Entered By: Levan Hurst on 05/16/2019 17:36:07 -------------------------------------------------------------------------------- Pain Assessment Details Patient Name: Date of Service: [name], [name] 05/16/2019 9:15 AM Medical Record [id number] Patient Account Number: [id number] Date of Birth/Sex: Treating RN: July 17, 1966 (53 y.o. [name] Primary Care Marchele Decock: [facility], [place] Other Clinician: Referring Jamilette Suchocki: Treating Dhrithi Riche/Extender:Robson, Vivia Budge, [place] Weeks in Treatment: 9 Active Problems Location of Pain Severity and Description of Pain Patient Has Paino No Site Locations Pain Management and Medication Current Pain Management: Electronic Signature(s) Signed: 05/16/2019 6:00:55 PM By: Levan Hurst RN, BSN Signed: 07/08/2019 3:02:15 PM By: Sandre Kitty Entered By: Sandre Kitty on 05/16/2019 09:21:45 -------------------------------------------------------------------------------- Patient/Caregiver Education Details Patient Name: Date of Service: [name] 10/16/2020andnbsp9:15 AM Medical Record [id number] Patient Account Number: [id number] Date of Birth/Gender: 05/02/1966 (53 y.o. M) Treating RN: Levan Hurst Primary Care Physician: [facility], [place] Other Clinician: Referring Physician:  Treating Physician/Extender:Robson, Vivia Budge, [place] Weeks in Treatment: 9 Education Assessment Education Provided To: Patient Education Topics Provided Wound/Skin Impairment: Methods: Explain/Verbal Responses: State content correctly Electronic Signature(s) Signed: 05/16/2019 6:00:55 PM By: Levan Hurst RN, BSN Entered By: Levan Hurst on 05/16/2019 09:25:19 -------------------------------------------------------------------------------- Wound Assessment Details Patient Name: Date of Service: [name], [name] 05/16/2019 9:15 AM Medical Record [id number] Patient Account Number: [id number] Date of Birth/Sex: Treating RN: [birth year] (53 y.o. [name] Primary Care Foch Rosenwald: [facility], [place] Other Clinician: Referring Vincenzo Stave: Treating Emileigh Kellett/Extender:Robson, Vivia Budge, [place] Weeks in Treatment: 9 Wound Status Wound Number: 1 Primary Etiology: Trauma, Other Wound Location: Right Lower Leg - Anterior Wound Status: Open Wounding Event: Trauma Comorbid History: Hypertension Date Acquired: 02/25/2019 Weeks Of Treatment: 9 Clustered Wound: No Photos Wound Measurements Length: (cm) 1 % Reduct Width: (cm) 0.7 % Reduct Depth: (cm) 0.1 Epitheli Area: (cm) 0.55 Volume: (cm) 0.055 Wound Description Classification: Full Thickness Without Exposed Support Foul Od Structures Slough/ Wound Flat and Intact Margin: Exudate Medium Amount: Exudate Serosanguineous Type: Exudate red, brown Color: Wound Bed Granulation Amount: Large (67-100%) Granulation Quality: Red, Pink Fascia Necrotic Amount: None Present (0%) Fat Layer Tendon Exp Muscle Exp Joint Expo Bone Expos or After Cleansing: No Fibrino No Exposed Structure Exposed: No (Subcutaneous Tissue) Exposed: Yes osed: No osed: No sed: No ed: No ion in Area: 96.9% ion in Volume: 99.8% alization: Small (1-33%) Electronic Signature(s) Signed: 05/21/2019 6:50:57 PM By: Levan Hurst RN,  BSN Signed: 05/22/2019  4:25:07 PM By: Benjaman Kindler EMT/HBOT Previous Signature: 05/16/2019 6:00:55 PM Version By: Zandra Abts RN, BSN Entered By: Benjaman Kindler on 05/21/2019 10:53:22 -------------------------------------------------------------------------------- Vitals Details Patient Name: Date of Service: [name], [name] 05/16/2019 9:15 AM Medical Record [id number] Patient Account Number: [id number] Date of Birth/Sex: Treating RN: [birth year] (53 y.o. [name] Primary Care Abdulah Iqbal: [facility], [place] Other Clinician: Referring Arjan Strohm: Treating Haya Hemler/Extender:Robson, Myra Rude, [place] Weeks in Treatment: 9 Vital Signs Time Taken: 09:19 Temperature (F): 98.5 Height (in): 69 Pulse (bpm): 63 Weight (lbs): 325 Respiratory Rate (breaths/min): 20 Body Mass Index (BMI): 48 Blood Pressure (mmHg): 144/76 Reference Range: 80 - 120 mg / dl Electronic Signature(s) Signed: 07/08/2019 3:02:15 PM By: Karl Ito Entered By: Karl Ito on 05/16/2019 09:21:38

## 2021-03-09 IMAGING — DX RIGHT TIBIA AND FIBULA - 2 VIEW
4 series · 4 of 4 positions shown · non-contrast
Comparison: None.

CLINICAL DATA: Fall several days ago with open leg wound and
drainage, initial encounter

EXAM:
RIGHT TIBIA AND FIBULA - 2 VIEW

[x tib-fib ap right (1 of 2)]
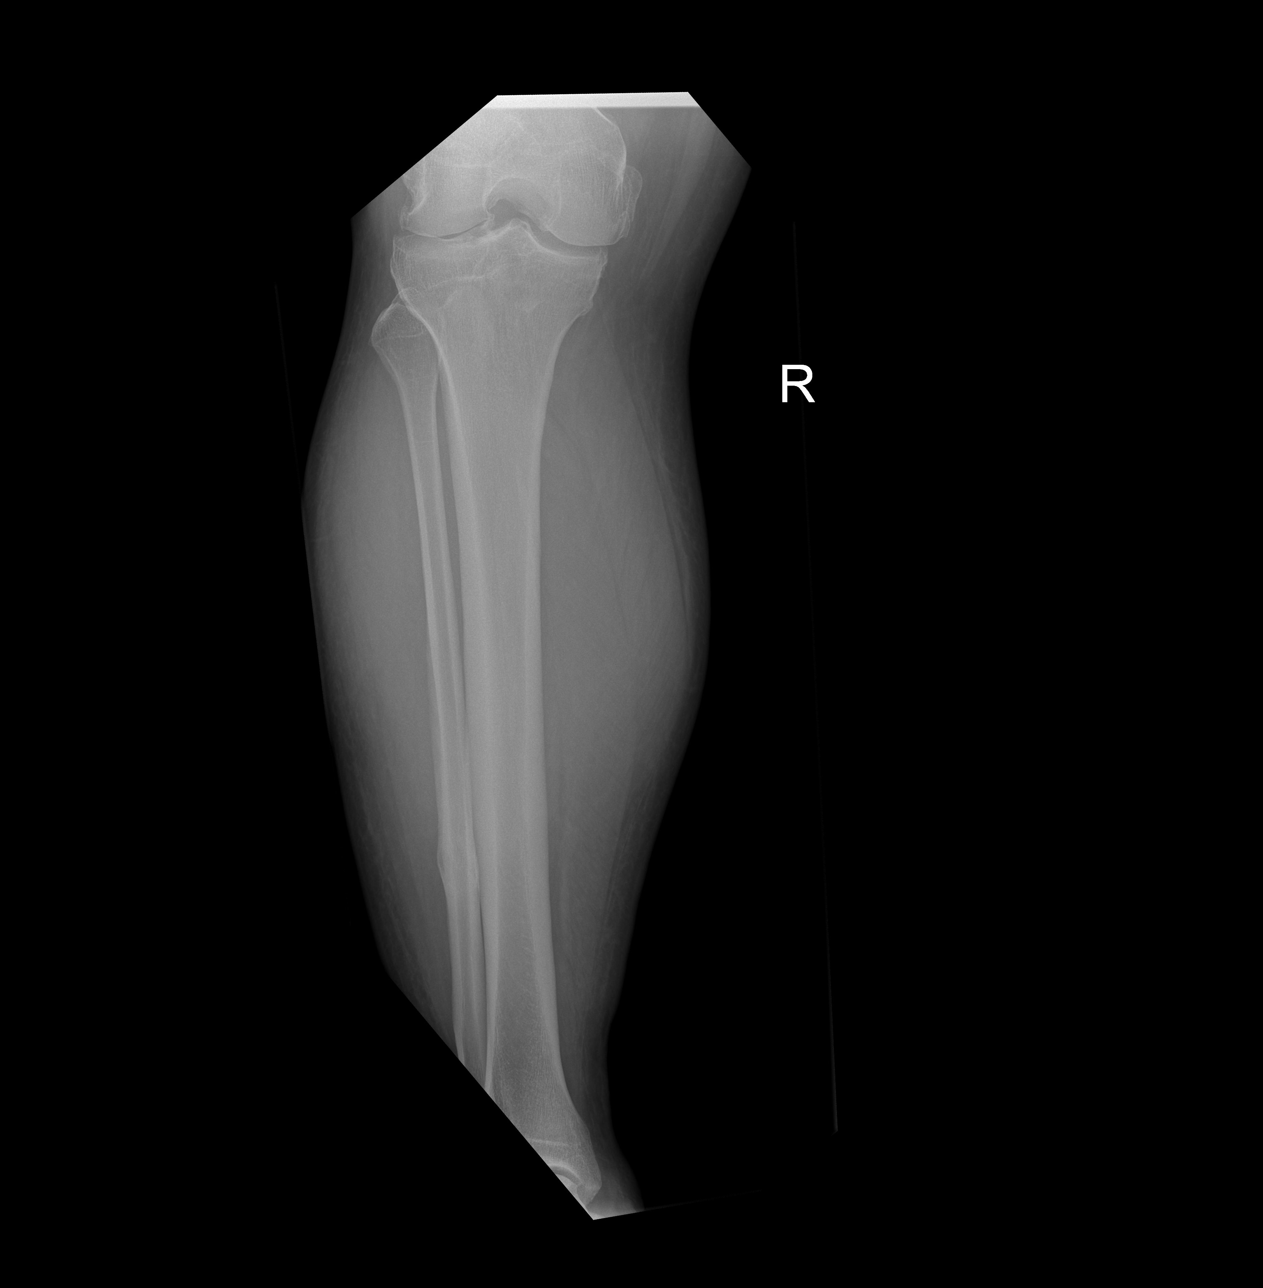

[x tib-fib ap right (2 of 2)]
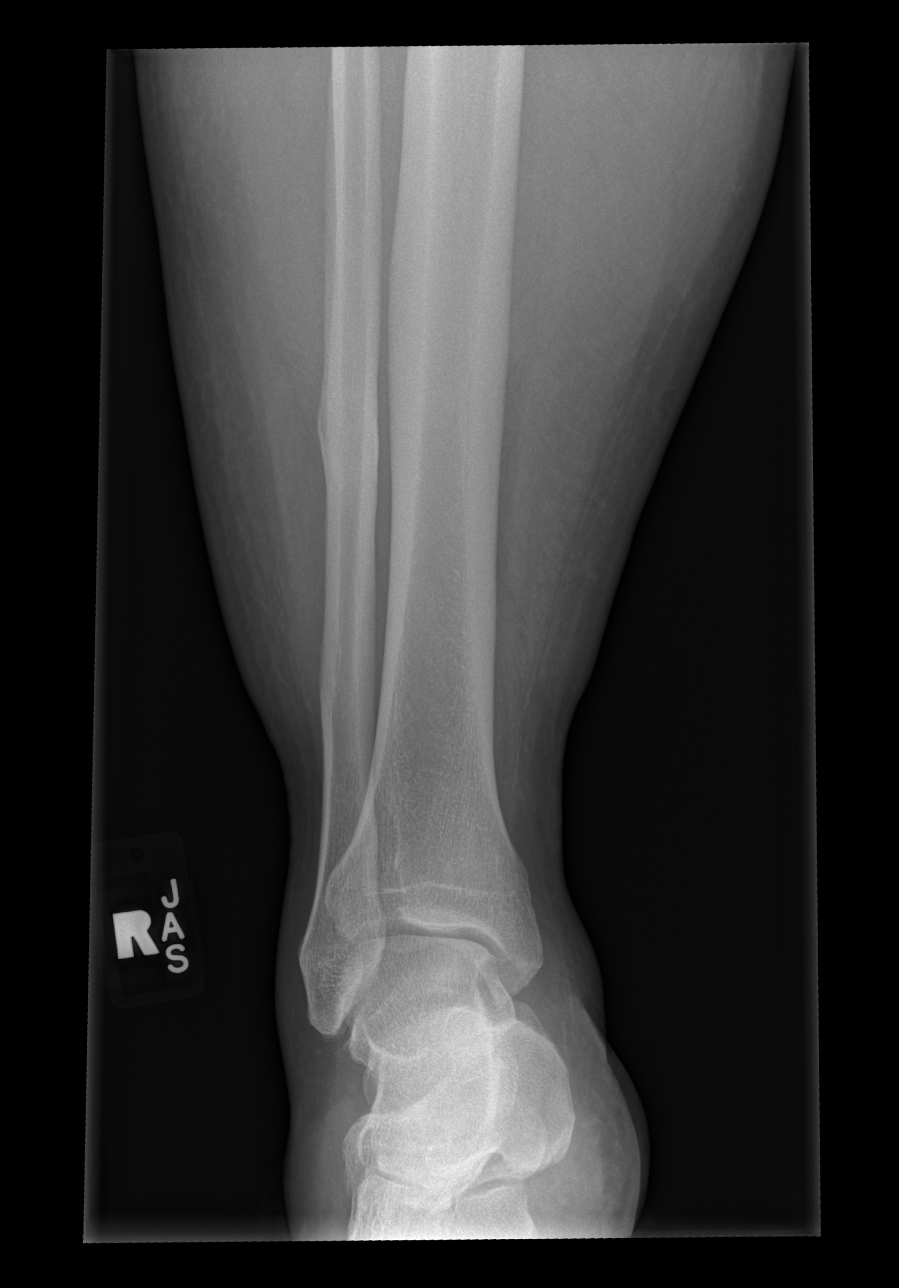

[x tib-fib lat right (1 of 2)]
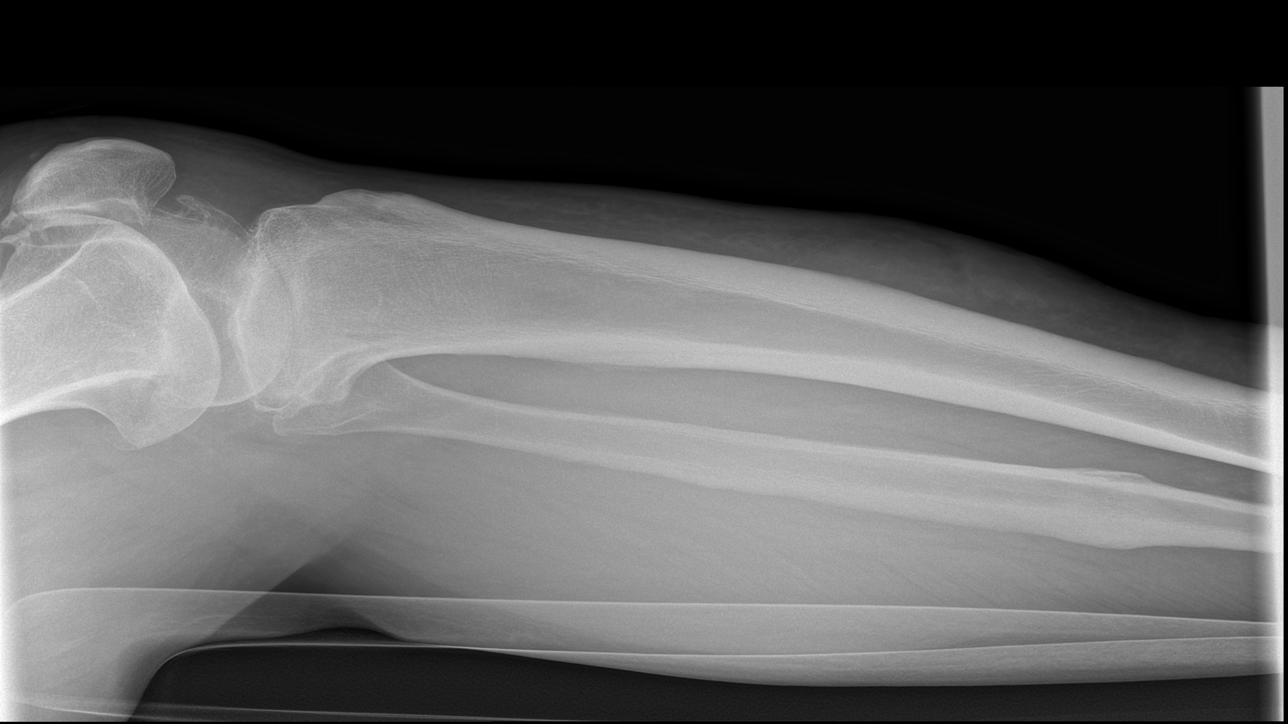

[x tib-fib lat right (2 of 2)]
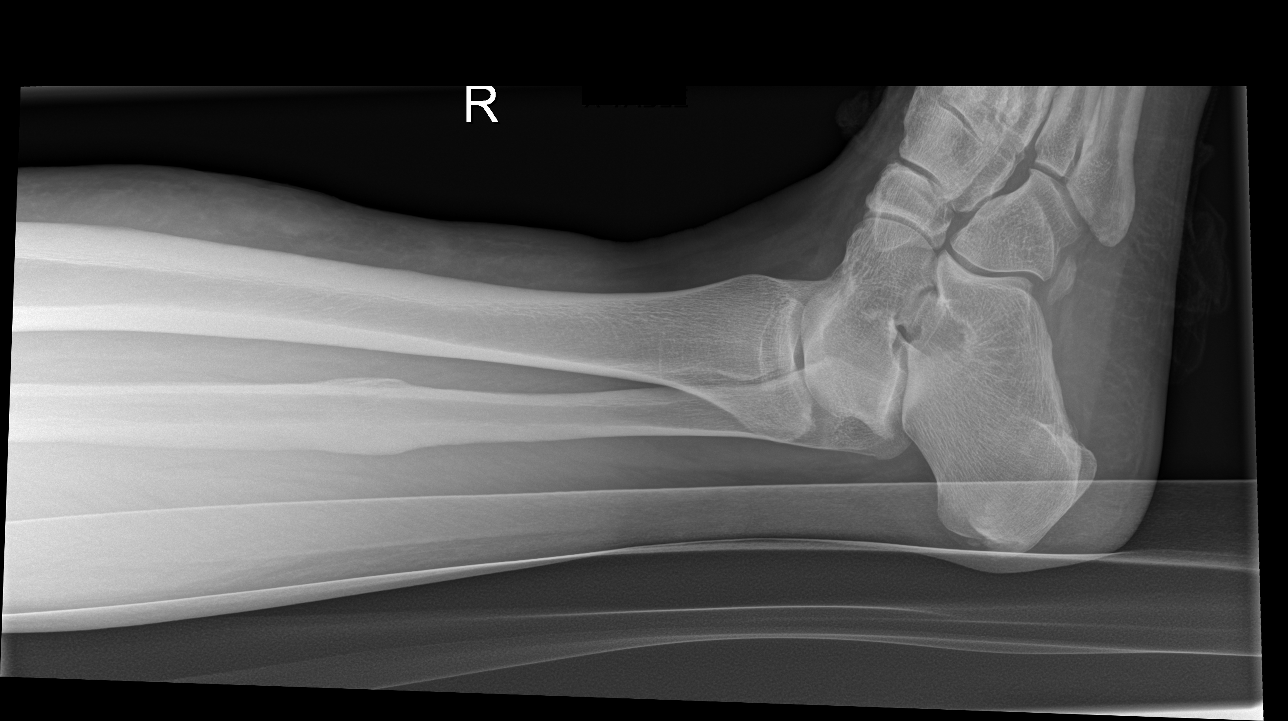

[4 of 4 positions shown; findings below may reference images not displayed]

FINDINGS: Changes consistent with prior healed fibular fracture are noted. No
acute fracture is seen. Mild degenerative changes about the knee
joint are seen. Soft tissue swelling is noted anteriorly without
underlying bony erosion.
IMPRESSION: Soft tissue

Injury without bony erosion.
